# Patient Record
Sex: Female | Born: 1935 | Race: White | Hispanic: No | Marital: Married | Smoking: Former smoker
Health system: Southern US, Community
[De-identification: ages and names within clinical notes are randomized; demographics above are authoritative.]

## PROBLEM LIST (undated history)

## (undated) DIAGNOSIS — F419 Anxiety disorder, unspecified: Secondary | ICD-10-CM

## (undated) DIAGNOSIS — G309 Alzheimer's disease, unspecified: Secondary | ICD-10-CM

## (undated) DIAGNOSIS — G4730 Sleep apnea, unspecified: Secondary | ICD-10-CM

## (undated) DIAGNOSIS — F0280 Dementia in other diseases classified elsewhere without behavioral disturbance: Secondary | ICD-10-CM

## (undated) DIAGNOSIS — R55 Syncope and collapse: Secondary | ICD-10-CM

## (undated) DIAGNOSIS — E860 Dehydration: Secondary | ICD-10-CM

## (undated) DIAGNOSIS — R001 Bradycardia, unspecified: Secondary | ICD-10-CM

## (undated) DIAGNOSIS — I10 Essential (primary) hypertension: Secondary | ICD-10-CM

## (undated) DIAGNOSIS — C50919 Malignant neoplasm of unspecified site of unspecified female breast: Secondary | ICD-10-CM

## (undated) DIAGNOSIS — F329 Major depressive disorder, single episode, unspecified: Secondary | ICD-10-CM

## (undated) DIAGNOSIS — F32A Depression, unspecified: Secondary | ICD-10-CM

## (undated) DIAGNOSIS — E039 Hypothyroidism, unspecified: Secondary | ICD-10-CM

## (undated) DIAGNOSIS — N390 Urinary tract infection, site not specified: Secondary | ICD-10-CM

## (undated) HISTORY — PX: CHOLECYSTECTOMY: SHX55

## (undated) HISTORY — DX: Anxiety disorder, unspecified: F41.9

## (undated) HISTORY — PX: BREAST SURGERY: SHX581

## (undated) HISTORY — DX: Hypothyroidism, unspecified: E03.9

## (undated) HISTORY — DX: Dementia in other diseases classified elsewhere, unspecified severity, without behavioral disturbance, psychotic disturbance, mood disturbance, and anxiety: F02.80

## (undated) HISTORY — DX: Essential (primary) hypertension: I10

## (undated) HISTORY — DX: Sleep apnea, unspecified: G47.30

## (undated) HISTORY — DX: Dehydration: E86.0

## (undated) HISTORY — DX: Bradycardia, unspecified: R00.1

## (undated) HISTORY — DX: Syncope and collapse: R55

## (undated) HISTORY — DX: Urinary tract infection, site not specified: N39.0

## (undated) HISTORY — DX: Malignant neoplasm of unspecified site of unspecified female breast: C50.919

## (undated) HISTORY — DX: Depression, unspecified: F32.A

## (undated) HISTORY — DX: Major depressive disorder, single episode, unspecified: F32.9

## (undated) HISTORY — DX: Alzheimer's disease, unspecified: G30.9

---

## 1998-01-20 ENCOUNTER — Other Ambulatory Visit: Admission: RE | Admit: 1998-01-20 | Discharge: 1998-01-20 | Payer: Self-pay | Admitting: Family Medicine

## 1998-04-08 ENCOUNTER — Ambulatory Visit (HOSPITAL_COMMUNITY): Admission: RE | Admit: 1998-04-08 | Discharge: 1998-04-08 | Payer: Self-pay | Admitting: Gastroenterology

## 1998-04-25 ENCOUNTER — Encounter: Admission: RE | Admit: 1998-04-25 | Discharge: 1998-07-24 | Payer: Self-pay | Admitting: Gastroenterology

## 1998-05-10 ENCOUNTER — Emergency Department (HOSPITAL_COMMUNITY): Admission: EM | Admit: 1998-05-10 | Discharge: 1998-05-10 | Payer: Self-pay | Admitting: Family Medicine

## 1998-05-10 ENCOUNTER — Encounter: Payer: Self-pay | Admitting: Family Medicine

## 1999-05-26 ENCOUNTER — Emergency Department (HOSPITAL_COMMUNITY): Admission: EM | Admit: 1999-05-26 | Discharge: 1999-05-26 | Payer: Self-pay | Admitting: Internal Medicine

## 1999-05-26 ENCOUNTER — Encounter: Payer: Self-pay | Admitting: Internal Medicine

## 1999-12-01 ENCOUNTER — Encounter (INDEPENDENT_AMBULATORY_CARE_PROVIDER_SITE_OTHER): Payer: Self-pay | Admitting: Specialist

## 1999-12-01 ENCOUNTER — Ambulatory Visit (HOSPITAL_COMMUNITY): Admission: RE | Admit: 1999-12-01 | Discharge: 1999-12-01 | Payer: Self-pay | Admitting: Orthopedic Surgery

## 1999-12-29 ENCOUNTER — Encounter: Payer: Self-pay | Admitting: Emergency Medicine

## 1999-12-29 ENCOUNTER — Inpatient Hospital Stay (HOSPITAL_COMMUNITY): Admission: EM | Admit: 1999-12-29 | Discharge: 2000-01-03 | Payer: Self-pay | Admitting: Emergency Medicine

## 1999-12-31 ENCOUNTER — Encounter: Payer: Self-pay | Admitting: Internal Medicine

## 2000-02-27 ENCOUNTER — Ambulatory Visit (HOSPITAL_COMMUNITY): Admission: RE | Admit: 2000-02-27 | Discharge: 2000-02-27 | Payer: Self-pay | Admitting: Gastroenterology

## 2000-02-27 ENCOUNTER — Encounter (INDEPENDENT_AMBULATORY_CARE_PROVIDER_SITE_OTHER): Payer: Self-pay

## 2000-04-23 ENCOUNTER — Other Ambulatory Visit: Admission: RE | Admit: 2000-04-23 | Discharge: 2000-04-23 | Payer: Self-pay | Admitting: Family Medicine

## 2001-05-12 ENCOUNTER — Encounter: Admission: RE | Admit: 2001-05-12 | Discharge: 2001-05-12 | Payer: Self-pay | Admitting: Family Medicine

## 2001-05-12 ENCOUNTER — Encounter: Payer: Self-pay | Admitting: Family Medicine

## 2001-05-14 ENCOUNTER — Encounter: Payer: Self-pay | Admitting: Gastroenterology

## 2001-05-14 ENCOUNTER — Encounter: Admission: RE | Admit: 2001-05-14 | Discharge: 2001-05-14 | Payer: Self-pay | Admitting: Gastroenterology

## 2002-10-09 ENCOUNTER — Encounter (INDEPENDENT_AMBULATORY_CARE_PROVIDER_SITE_OTHER): Payer: Self-pay | Admitting: Specialist

## 2002-10-09 ENCOUNTER — Other Ambulatory Visit: Admission: RE | Admit: 2002-10-09 | Discharge: 2002-10-09 | Payer: Self-pay | Admitting: Radiology

## 2002-10-09 ENCOUNTER — Encounter: Payer: Self-pay | Admitting: Surgery

## 2002-10-09 ENCOUNTER — Encounter: Admission: RE | Admit: 2002-10-09 | Discharge: 2002-10-09 | Payer: Self-pay | Admitting: Surgery

## 2002-10-19 ENCOUNTER — Ambulatory Visit (HOSPITAL_COMMUNITY): Admission: RE | Admit: 2002-10-19 | Discharge: 2002-10-19 | Payer: Self-pay | Admitting: Surgery

## 2002-10-19 ENCOUNTER — Encounter: Payer: Self-pay | Admitting: Surgery

## 2002-10-20 ENCOUNTER — Encounter: Payer: Self-pay | Admitting: Surgery

## 2002-10-23 ENCOUNTER — Encounter: Payer: Self-pay | Admitting: Surgery

## 2002-10-23 ENCOUNTER — Encounter: Admission: RE | Admit: 2002-10-23 | Discharge: 2002-10-23 | Payer: Self-pay | Admitting: Surgery

## 2002-10-26 ENCOUNTER — Encounter: Payer: Self-pay | Admitting: Surgery

## 2002-10-26 ENCOUNTER — Ambulatory Visit (HOSPITAL_BASED_OUTPATIENT_CLINIC_OR_DEPARTMENT_OTHER): Admission: RE | Admit: 2002-10-26 | Discharge: 2002-10-26 | Payer: Self-pay | Admitting: General Surgery

## 2002-10-26 ENCOUNTER — Encounter (INDEPENDENT_AMBULATORY_CARE_PROVIDER_SITE_OTHER): Payer: Self-pay | Admitting: *Deleted

## 2002-10-26 ENCOUNTER — Encounter: Admission: RE | Admit: 2002-10-26 | Discharge: 2002-10-26 | Payer: Self-pay | Admitting: Surgery

## 2002-11-03 ENCOUNTER — Ambulatory Visit: Admission: RE | Admit: 2002-11-03 | Discharge: 2003-01-27 | Payer: Self-pay | Admitting: Radiation Oncology

## 2003-07-22 ENCOUNTER — Ambulatory Visit (HOSPITAL_COMMUNITY): Admission: RE | Admit: 2003-07-22 | Discharge: 2003-07-22 | Payer: Self-pay | Admitting: Oncology

## 2003-07-22 ENCOUNTER — Encounter: Admission: RE | Admit: 2003-07-22 | Discharge: 2003-07-22 | Payer: Self-pay | Admitting: Oncology

## 2003-11-02 ENCOUNTER — Encounter: Admission: RE | Admit: 2003-11-02 | Discharge: 2003-11-02 | Payer: Self-pay | Admitting: Family Medicine

## 2003-12-10 ENCOUNTER — Encounter: Admission: RE | Admit: 2003-12-10 | Discharge: 2003-12-10 | Payer: Self-pay | Admitting: Family Medicine

## 2004-04-24 ENCOUNTER — Ambulatory Visit (HOSPITAL_COMMUNITY): Admission: RE | Admit: 2004-04-24 | Discharge: 2004-04-24 | Payer: Self-pay | Admitting: Oncology

## 2004-05-22 ENCOUNTER — Other Ambulatory Visit: Admission: RE | Admit: 2004-05-22 | Discharge: 2004-05-22 | Payer: Self-pay | Admitting: Family Medicine

## 2004-07-24 ENCOUNTER — Encounter: Admission: RE | Admit: 2004-07-24 | Discharge: 2004-07-24 | Payer: Self-pay | Admitting: Oncology

## 2004-10-23 ENCOUNTER — Ambulatory Visit: Payer: Self-pay | Admitting: Oncology

## 2004-10-25 ENCOUNTER — Ambulatory Visit (HOSPITAL_COMMUNITY): Admission: RE | Admit: 2004-10-25 | Discharge: 2004-10-25 | Payer: Self-pay | Admitting: Oncology

## 2005-01-13 ENCOUNTER — Emergency Department (HOSPITAL_COMMUNITY): Admission: EM | Admit: 2005-01-13 | Discharge: 2005-01-13 | Payer: Self-pay | Admitting: Emergency Medicine

## 2005-03-01 ENCOUNTER — Ambulatory Visit (HOSPITAL_COMMUNITY): Admission: RE | Admit: 2005-03-01 | Discharge: 2005-03-01 | Payer: Self-pay | Admitting: Gastroenterology

## 2005-03-01 ENCOUNTER — Encounter (INDEPENDENT_AMBULATORY_CARE_PROVIDER_SITE_OTHER): Payer: Self-pay | Admitting: *Deleted

## 2005-04-24 ENCOUNTER — Ambulatory Visit: Payer: Self-pay | Admitting: Oncology

## 2005-07-26 ENCOUNTER — Encounter: Admission: RE | Admit: 2005-07-26 | Discharge: 2005-07-26 | Payer: Self-pay | Admitting: Oncology

## 2005-10-03 ENCOUNTER — Encounter: Admission: RE | Admit: 2005-10-03 | Discharge: 2005-10-03 | Payer: Self-pay | Admitting: Surgery

## 2005-11-26 ENCOUNTER — Encounter: Payer: Self-pay | Admitting: Pulmonary Disease

## 2006-04-24 ENCOUNTER — Ambulatory Visit: Payer: Self-pay | Admitting: Oncology

## 2006-04-26 LAB — COMPREHENSIVE METABOLIC PANEL
AST: 35 U/L (ref 0–37)
Alkaline Phosphatase: 59 U/L (ref 39–117)
BUN: 20 mg/dL (ref 6–23)
Glucose, Bld: 114 mg/dL — ABNORMAL HIGH (ref 70–99)
Sodium: 142 mEq/L (ref 135–145)
Total Bilirubin: 0.6 mg/dL (ref 0.3–1.2)

## 2006-04-26 LAB — CBC WITH DIFFERENTIAL/PLATELET
BASO%: 0.6 % (ref 0.0–2.0)
Basophils Absolute: 0 10*3/uL (ref 0.0–0.1)
EOS%: 4.1 % (ref 0.0–7.0)
HGB: 13.6 g/dL (ref 11.6–15.9)
MCH: 31.1 pg (ref 26.0–34.0)
MCHC: 34.4 g/dL (ref 32.0–36.0)
MCV: 90.3 fL (ref 81.0–101.0)
MONO%: 8.3 % (ref 0.0–13.0)
RDW: 13.7 % (ref 11.3–14.5)

## 2006-05-06 ENCOUNTER — Encounter: Admission: RE | Admit: 2006-05-06 | Discharge: 2006-05-06 | Payer: Self-pay | Admitting: Oncology

## 2006-06-20 ENCOUNTER — Encounter: Admission: RE | Admit: 2006-06-20 | Discharge: 2006-06-20 | Payer: Self-pay | Admitting: Gastroenterology

## 2006-07-29 ENCOUNTER — Encounter: Admission: RE | Admit: 2006-07-29 | Discharge: 2006-07-29 | Payer: Self-pay | Admitting: Oncology

## 2006-12-04 ENCOUNTER — Other Ambulatory Visit: Admission: RE | Admit: 2006-12-04 | Discharge: 2006-12-04 | Payer: Self-pay | Admitting: Obstetrics and Gynecology

## 2006-12-05 ENCOUNTER — Ambulatory Visit (HOSPITAL_COMMUNITY): Admission: RE | Admit: 2006-12-05 | Discharge: 2006-12-05 | Payer: Self-pay | Admitting: Obstetrics and Gynecology

## 2007-04-23 ENCOUNTER — Ambulatory Visit: Payer: Self-pay | Admitting: Oncology

## 2007-04-25 LAB — CBC WITH DIFFERENTIAL/PLATELET
Basophils Absolute: 0 10*3/uL (ref 0.0–0.1)
Eosinophils Absolute: 0.4 10*3/uL (ref 0.0–0.5)
HGB: 13.6 g/dL (ref 11.6–15.9)
LYMPH%: 28.8 % (ref 14.0–48.0)
MCV: 88.2 fL (ref 81.0–101.0)
MONO%: 8.1 % (ref 0.0–13.0)
NEUT#: 3.4 10*3/uL (ref 1.5–6.5)
NEUT%: 56.1 % (ref 39.6–76.8)
Platelets: 153 10*3/uL (ref 145–400)

## 2007-04-25 LAB — COMPREHENSIVE METABOLIC PANEL
Albumin: 4.2 g/dL (ref 3.5–5.2)
Alkaline Phosphatase: 61 U/L (ref 39–117)
BUN: 13 mg/dL (ref 6–23)
Creatinine, Ser: 0.83 mg/dL (ref 0.40–1.20)
Glucose, Bld: 94 mg/dL (ref 70–99)
Total Bilirubin: 0.9 mg/dL (ref 0.3–1.2)

## 2007-06-21 ENCOUNTER — Encounter: Admission: RE | Admit: 2007-06-21 | Discharge: 2007-06-21 | Payer: Self-pay | Admitting: Family Medicine

## 2007-08-18 ENCOUNTER — Encounter: Admission: RE | Admit: 2007-08-18 | Discharge: 2007-08-18 | Payer: Self-pay | Admitting: Oncology

## 2007-08-28 ENCOUNTER — Encounter: Admission: RE | Admit: 2007-08-28 | Discharge: 2007-08-28 | Payer: Self-pay | Admitting: Family Medicine

## 2007-09-11 ENCOUNTER — Ambulatory Visit: Payer: Self-pay | Admitting: Oncology

## 2007-09-15 LAB — CBC WITH DIFFERENTIAL/PLATELET
BASO%: 0.6 % (ref 0.0–2.0)
EOS%: 5.8 % (ref 0.0–7.0)
HCT: 39.2 % (ref 34.8–46.6)
MCH: 31.1 pg (ref 26.0–34.0)
MCHC: 35.2 g/dL (ref 32.0–36.0)
MONO#: 0.4 10*3/uL (ref 0.1–0.9)
RBC: 4.44 10*6/uL (ref 3.70–5.32)
RDW: 13.3 % (ref 11.3–14.5)
WBC: 4.7 10*3/uL (ref 3.9–10.0)
lymph#: 1.8 10*3/uL (ref 0.9–3.3)

## 2007-09-15 LAB — COMPREHENSIVE METABOLIC PANEL
ALT: 45 U/L — ABNORMAL HIGH (ref 0–35)
AST: 35 U/L (ref 0–37)
Albumin: 4.2 g/dL (ref 3.5–5.2)
CO2: 26 mEq/L (ref 19–32)
Calcium: 9.7 mg/dL (ref 8.4–10.5)
Chloride: 106 mEq/L (ref 96–112)
Creatinine, Ser: 0.88 mg/dL (ref 0.40–1.20)
Potassium: 3.9 mEq/L (ref 3.5–5.3)
Sodium: 144 mEq/L (ref 135–145)
Total Protein: 7.4 g/dL (ref 6.0–8.3)

## 2007-09-15 LAB — CANCER ANTIGEN 27.29: CA 27.29: 24 U/mL (ref 0–39)

## 2007-10-09 ENCOUNTER — Emergency Department (HOSPITAL_COMMUNITY): Admission: EM | Admit: 2007-10-09 | Discharge: 2007-10-09 | Payer: Self-pay | Admitting: Emergency Medicine

## 2007-11-07 ENCOUNTER — Ambulatory Visit: Payer: Self-pay | Admitting: Oncology

## 2008-04-14 ENCOUNTER — Ambulatory Visit: Payer: Self-pay | Admitting: Pulmonary Disease

## 2008-04-14 DIAGNOSIS — C50919 Malignant neoplasm of unspecified site of unspecified female breast: Secondary | ICD-10-CM | POA: Insufficient documentation

## 2008-04-14 DIAGNOSIS — G4733 Obstructive sleep apnea (adult) (pediatric): Secondary | ICD-10-CM | POA: Insufficient documentation

## 2008-04-14 DIAGNOSIS — E039 Hypothyroidism, unspecified: Secondary | ICD-10-CM | POA: Insufficient documentation

## 2008-04-14 DIAGNOSIS — I10 Essential (primary) hypertension: Secondary | ICD-10-CM | POA: Insufficient documentation

## 2008-04-29 ENCOUNTER — Telehealth: Payer: Self-pay | Admitting: Pulmonary Disease

## 2008-05-10 ENCOUNTER — Encounter: Payer: Self-pay | Admitting: Pulmonary Disease

## 2008-05-10 ENCOUNTER — Ambulatory Visit (HOSPITAL_BASED_OUTPATIENT_CLINIC_OR_DEPARTMENT_OTHER): Admission: RE | Admit: 2008-05-10 | Discharge: 2008-05-10 | Payer: Self-pay | Admitting: Pulmonary Disease

## 2008-05-18 ENCOUNTER — Ambulatory Visit: Payer: Self-pay | Admitting: Pulmonary Disease

## 2008-05-20 ENCOUNTER — Telehealth (INDEPENDENT_AMBULATORY_CARE_PROVIDER_SITE_OTHER): Payer: Self-pay | Admitting: *Deleted

## 2008-05-21 ENCOUNTER — Ambulatory Visit: Payer: Self-pay | Admitting: Pulmonary Disease

## 2008-06-10 ENCOUNTER — Ambulatory Visit: Payer: Self-pay | Admitting: Pulmonary Disease

## 2008-06-10 ENCOUNTER — Telehealth (INDEPENDENT_AMBULATORY_CARE_PROVIDER_SITE_OTHER): Payer: Self-pay | Admitting: *Deleted

## 2008-06-16 ENCOUNTER — Telehealth (INDEPENDENT_AMBULATORY_CARE_PROVIDER_SITE_OTHER): Payer: Self-pay | Admitting: *Deleted

## 2008-06-22 ENCOUNTER — Telehealth (INDEPENDENT_AMBULATORY_CARE_PROVIDER_SITE_OTHER): Payer: Self-pay | Admitting: *Deleted

## 2008-07-26 ENCOUNTER — Telehealth: Payer: Self-pay | Admitting: Pulmonary Disease

## 2008-08-18 ENCOUNTER — Encounter: Admission: RE | Admit: 2008-08-18 | Discharge: 2008-08-18 | Payer: Self-pay | Admitting: Oncology

## 2008-11-01 ENCOUNTER — Ambulatory Visit: Payer: Self-pay | Admitting: Oncology

## 2008-11-03 LAB — CBC WITH DIFFERENTIAL/PLATELET
Basophils Absolute: 0 10*3/uL (ref 0.0–0.1)
Eosinophils Absolute: 0.2 10*3/uL (ref 0.0–0.5)
HGB: 13.3 g/dL (ref 11.6–15.9)
LYMPH%: 41.8 % (ref 14.0–49.7)
MCV: 90.6 fL (ref 79.5–101.0)
MONO#: 0.4 10*3/uL (ref 0.1–0.9)
MONO%: 9.4 % (ref 0.0–14.0)
NEUT#: 1.9 10*3/uL (ref 1.5–6.5)
Platelets: 129 10*3/uL — ABNORMAL LOW (ref 145–400)
WBC: 4.4 10*3/uL (ref 3.9–10.3)

## 2008-11-04 LAB — COMPREHENSIVE METABOLIC PANEL
Albumin: 4.3 g/dL (ref 3.5–5.2)
Alkaline Phosphatase: 58 U/L (ref 39–117)
BUN: 18 mg/dL (ref 6–23)
CO2: 28 mEq/L (ref 19–32)
Glucose, Bld: 92 mg/dL (ref 70–99)
Potassium: 4.1 mEq/L (ref 3.5–5.3)
Sodium: 140 mEq/L (ref 135–145)
Total Protein: 7.2 g/dL (ref 6.0–8.3)

## 2008-11-04 LAB — CANCER ANTIGEN 27.29: CA 27.29: 30 U/mL (ref 0–39)

## 2008-11-04 LAB — VITAMIN D 25 HYDROXY (VIT D DEFICIENCY, FRACTURES): Vit D, 25-Hydroxy: 36 ng/mL (ref 30–89)

## 2009-05-02 ENCOUNTER — Encounter: Admission: RE | Admit: 2009-05-02 | Discharge: 2009-05-02 | Payer: Self-pay | Admitting: Family Medicine

## 2009-08-29 ENCOUNTER — Encounter: Admission: RE | Admit: 2009-08-29 | Discharge: 2009-08-29 | Payer: Self-pay | Admitting: Surgery

## 2009-11-08 ENCOUNTER — Ambulatory Visit: Payer: Self-pay | Admitting: Oncology

## 2009-11-14 ENCOUNTER — Encounter: Admission: RE | Admit: 2009-11-14 | Discharge: 2009-11-14 | Payer: Self-pay | Admitting: Family Medicine

## 2010-01-19 ENCOUNTER — Emergency Department (HOSPITAL_COMMUNITY): Admission: EM | Admit: 2010-01-19 | Discharge: 2010-01-19 | Payer: Self-pay | Admitting: Emergency Medicine

## 2010-03-03 ENCOUNTER — Encounter: Admission: RE | Admit: 2010-03-03 | Discharge: 2010-03-03 | Payer: Self-pay | Admitting: Neurology

## 2010-10-17 ENCOUNTER — Other Ambulatory Visit: Payer: Self-pay | Admitting: Internal Medicine

## 2010-10-17 ENCOUNTER — Ambulatory Visit
Admission: RE | Admit: 2010-10-17 | Discharge: 2010-10-17 | Disposition: A | Discharge status: Home / Self Care | Payer: Medicare Other | Source: Ambulatory Visit | Attending: *Deleted | Admitting: *Deleted

## 2010-10-17 DIAGNOSIS — R1032 Left lower quadrant pain: Secondary | ICD-10-CM

## 2010-10-30 LAB — DIFFERENTIAL
Basophils Relative: 1 % (ref 0–1)
Eosinophils Absolute: 0.1 10*3/uL (ref 0.0–0.7)
Lymphs Abs: 1.2 10*3/uL (ref 0.7–4.0)
Monocytes Relative: 12 % (ref 3–12)
Neutrophils Relative %: 48 % (ref 43–77)

## 2010-10-30 LAB — URINALYSIS, ROUTINE W REFLEX MICROSCOPIC
Bilirubin Urine: NEGATIVE
Glucose, UA: NEGATIVE mg/dL
Specific Gravity, Urine: 1.007 (ref 1.005–1.030)
Urobilinogen, UA: 0.2 mg/dL (ref 0.0–1.0)
pH: 7 (ref 5.0–8.0)

## 2010-10-30 LAB — CBC
HCT: 36 % (ref 36.0–46.0)
MCHC: 34.2 g/dL (ref 30.0–36.0)
MCV: 91.5 fL (ref 78.0–100.0)

## 2010-10-30 LAB — BASIC METABOLIC PANEL
Calcium: 9.1 mg/dL (ref 8.4–10.5)
GFR calc Af Amer: >60 mL/min (ref >60)
Sodium: 142 mEq/L (ref 135–145)

## 2010-10-30 LAB — POCT CARDIAC MARKERS

## 2010-11-29 ENCOUNTER — Emergency Department (HOSPITAL_COMMUNITY): Payer: Medicare Other

## 2010-11-29 ENCOUNTER — Inpatient Hospital Stay (HOSPITAL_COMMUNITY)
Admission: EM | Admit: 2010-11-29 | Discharge: 2010-12-04 | DRG: 312 | Disposition: A | Discharge status: Nursing Home (Includes ICF) | Payer: Medicare Other | Attending: Cardiology | Admitting: Cardiology

## 2010-11-29 DIAGNOSIS — Z853 Personal history of malignant neoplasm of breast: Secondary | ICD-10-CM

## 2010-11-29 DIAGNOSIS — E039 Hypothyroidism, unspecified: Secondary | ICD-10-CM | POA: Diagnosis present

## 2010-11-29 DIAGNOSIS — G309 Alzheimer's disease, unspecified: Secondary | ICD-10-CM | POA: Diagnosis present

## 2010-11-29 DIAGNOSIS — F341 Dysthymic disorder: Secondary | ICD-10-CM | POA: Diagnosis present

## 2010-11-29 DIAGNOSIS — I10 Essential (primary) hypertension: Secondary | ICD-10-CM | POA: Diagnosis present

## 2010-11-29 DIAGNOSIS — W07XXXA Fall from chair, initial encounter: Secondary | ICD-10-CM | POA: Diagnosis present

## 2010-11-29 DIAGNOSIS — R55 Syncope and collapse: Principal | ICD-10-CM | POA: Diagnosis present

## 2010-11-29 DIAGNOSIS — I498 Other specified cardiac arrhythmias: Secondary | ICD-10-CM | POA: Diagnosis present

## 2010-11-29 DIAGNOSIS — T448X5A Adverse effect of centrally-acting and adrenergic-neuron-blocking agents, initial encounter: Secondary | ICD-10-CM | POA: Diagnosis present

## 2010-11-29 DIAGNOSIS — T441X5A Adverse effect of other parasympathomimetics [cholinergics], initial encounter: Secondary | ICD-10-CM | POA: Diagnosis present

## 2010-11-29 DIAGNOSIS — F0280 Dementia in other diseases classified elsewhere without behavioral disturbance: Secondary | ICD-10-CM | POA: Diagnosis present

## 2010-11-29 DIAGNOSIS — Y9210 Unspecified residential institution as the place of occurrence of the external cause: Secondary | ICD-10-CM | POA: Diagnosis present

## 2010-11-29 LAB — CBC
HCT: 38.5 % (ref 36.0–46.0)
Hemoglobin: 12.3 g/dL (ref 12.0–15.0)
MCH: 29.1 pg (ref 26.0–34.0)
MCHC: 31.9 g/dL (ref 30.0–36.0)
MCV: 91 fL (ref 78.0–100.0)

## 2010-11-29 LAB — URINE MICROSCOPIC-ADD ON

## 2010-11-29 LAB — POCT CARDIAC MARKERS
CKMB, poc: 1.1 ng/mL (ref 1.0–8.0)
Myoglobin, poc: 53.5 ng/mL (ref 12–200)

## 2010-11-29 LAB — URINALYSIS, ROUTINE W REFLEX MICROSCOPIC
Hgb urine dipstick: NEGATIVE
Ketones, ur: NEGATIVE mg/dL
Nitrite: NEGATIVE
Protein, ur: NEGATIVE mg/dL
Specific Gravity, Urine: 1.007 (ref 1.005–1.030)
pH: 7 (ref 5.0–8.0)

## 2010-11-29 LAB — COMPREHENSIVE METABOLIC PANEL
ALT: 18 U/L (ref 0–35)
AST: 22 U/L (ref 0–37)
Alkaline Phosphatase: 67 U/L (ref 39–117)
Chloride: 104 mEq/L (ref 96–112)
Creatinine, Ser: 0.82 mg/dL (ref 0.4–1.2)
GFR calc Af Amer: >60 mL/min (ref >60)
Glucose, Bld: 85 mg/dL (ref 70–99)
Potassium: 4.1 mEq/L (ref 3.5–5.1)
Total Protein: 7 g/dL (ref 6.0–8.3)

## 2010-11-29 LAB — DIFFERENTIAL
Basophils Absolute: 0 10*3/uL (ref 0.0–0.1)
Eosinophils Relative: 5 % (ref 0–5)
Monocytes Absolute: 0.5 10*3/uL (ref 0.1–1.0)
Neutro Abs: 1.9 10*3/uL (ref 1.7–7.7)

## 2010-11-30 LAB — BASIC METABOLIC PANEL
GFR calc non Af Amer: >60 mL/min (ref >60)
Glucose, Bld: 94 mg/dL (ref 70–99)
Potassium: 3.7 mEq/L (ref 3.5–5.1)
Sodium: 141 mEq/L (ref 135–145)

## 2010-11-30 LAB — CBC
HCT: 35.4 % — ABNORMAL LOW (ref 36.0–46.0)
RDW: 13.4 % (ref 11.5–15.5)
WBC: 3.6 10*3/uL — ABNORMAL LOW (ref 4.0–10.5)

## 2010-11-30 LAB — MRSA PCR SCREENING: MRSA by PCR: POSITIVE — AB

## 2010-11-30 LAB — TSH: TSH: 4.288 u[IU]/mL (ref 0.350–4.500)

## 2010-12-06 ENCOUNTER — Inpatient Hospital Stay (HOSPITAL_COMMUNITY)
Admission: EM | Admit: 2010-12-06 | Discharge: 2010-12-08 | DRG: 690 | Disposition: A | Discharge status: Nursing Home (Includes ICF) | Payer: Medicare Other | Attending: Internal Medicine | Admitting: Internal Medicine

## 2010-12-06 DIAGNOSIS — F19951 Other psychoactive substance use, unspecified with psychoactive substance-induced psychotic disorder with hallucinations: Secondary | ICD-10-CM | POA: Diagnosis present

## 2010-12-06 DIAGNOSIS — F0280 Dementia in other diseases classified elsewhere without behavioral disturbance: Secondary | ICD-10-CM | POA: Diagnosis present

## 2010-12-06 DIAGNOSIS — I10 Essential (primary) hypertension: Secondary | ICD-10-CM | POA: Diagnosis present

## 2010-12-06 DIAGNOSIS — F19921 Other psychoactive substance use, unspecified with intoxication with delirium: Secondary | ICD-10-CM | POA: Diagnosis present

## 2010-12-06 DIAGNOSIS — G4730 Sleep apnea, unspecified: Secondary | ICD-10-CM | POA: Diagnosis present

## 2010-12-06 DIAGNOSIS — T50995A Adverse effect of other drugs, medicaments and biological substances, initial encounter: Secondary | ICD-10-CM | POA: Diagnosis present

## 2010-12-06 DIAGNOSIS — F341 Dysthymic disorder: Secondary | ICD-10-CM | POA: Diagnosis present

## 2010-12-06 DIAGNOSIS — N390 Urinary tract infection, site not specified: Principal | ICD-10-CM | POA: Diagnosis present

## 2010-12-06 DIAGNOSIS — E860 Dehydration: Secondary | ICD-10-CM | POA: Diagnosis present

## 2010-12-06 DIAGNOSIS — G309 Alzheimer's disease, unspecified: Secondary | ICD-10-CM | POA: Diagnosis present

## 2010-12-06 DIAGNOSIS — IMO0002 Reserved for concepts with insufficient information to code with codable children: Secondary | ICD-10-CM | POA: Diagnosis present

## 2010-12-06 DIAGNOSIS — E039 Hypothyroidism, unspecified: Secondary | ICD-10-CM | POA: Diagnosis present

## 2010-12-06 DIAGNOSIS — Z79899 Other long term (current) drug therapy: Secondary | ICD-10-CM

## 2010-12-06 DIAGNOSIS — Z853 Personal history of malignant neoplasm of breast: Secondary | ICD-10-CM

## 2010-12-06 LAB — CBC
Hemoglobin: 11.2 g/dL — ABNORMAL LOW (ref 12.0–15.0)
RBC: 3.77 MIL/uL — ABNORMAL LOW (ref 3.87–5.11)
WBC: 4.8 10*3/uL (ref 4.0–10.5)

## 2010-12-06 LAB — URINALYSIS, ROUTINE W REFLEX MICROSCOPIC
Glucose, UA: NEGATIVE mg/dL
Hgb urine dipstick: NEGATIVE
Specific Gravity, Urine: 1.015 (ref 1.005–1.030)
Urobilinogen, UA: 1 mg/dL (ref 0.0–1.0)

## 2010-12-06 LAB — BASIC METABOLIC PANEL
CO2: 26 mEq/L (ref 19–32)
Chloride: 101 mEq/L (ref 96–112)
GFR calc Af Amer: 44 mL/min — ABNORMAL LOW (ref >60)
Potassium: 4.1 mEq/L (ref 3.5–5.1)
Sodium: 135 mEq/L (ref 135–145)

## 2010-12-06 LAB — DIFFERENTIAL
Basophils Absolute: 0 10*3/uL (ref 0.0–0.1)
Basophils Relative: 1 % (ref 0–1)
Monocytes Relative: 10 % (ref 3–12)
Neutro Abs: 2.6 10*3/uL (ref 1.7–7.7)
Neutrophils Relative %: 53 % (ref 43–77)

## 2010-12-06 LAB — URINE MICROSCOPIC-ADD ON

## 2010-12-07 LAB — GLUCOSE, CAPILLARY: Glucose-Capillary: 87 mg/dL (ref 70–99)

## 2010-12-07 LAB — BASIC METABOLIC PANEL
BUN: 22 mg/dL (ref 6–23)
Chloride: 104 mEq/L (ref 96–112)
Creatinine, Ser: 1.15 mg/dL (ref 0.4–1.2)
Glucose, Bld: 85 mg/dL (ref 70–99)

## 2010-12-07 LAB — MRSA PCR SCREENING: MRSA by PCR: NEGATIVE

## 2010-12-08 LAB — CBC
MCH: 29.2 pg (ref 26.0–34.0)
MCHC: 32.5 g/dL (ref 30.0–36.0)
MCV: 89.6 fL (ref 78.0–100.0)
Platelets: 136 10*3/uL — ABNORMAL LOW (ref 150–400)
RBC: 4.15 MIL/uL (ref 3.87–5.11)
RDW: 13.4 % (ref 11.5–15.5)

## 2010-12-08 LAB — BASIC METABOLIC PANEL
BUN: 16 mg/dL (ref 6–23)
Calcium: 9.1 mg/dL (ref 8.4–10.5)
Chloride: 104 mEq/L (ref 96–112)
Creatinine, Ser: 0.92 mg/dL (ref 0.4–1.2)
GFR calc Af Amer: >60 mL/min (ref >60)
GFR calc non Af Amer: 59 mL/min — ABNORMAL LOW (ref >60)

## 2010-12-08 LAB — URINE CULTURE: Colony Count: 30000

## 2010-12-08 NOTE — Discharge Summary (Addendum)
[name], [name]                ACCOUNT NO.:  [id number]  MEDICAL RECORD NO.:  [id number]           PATIENT TYPE:  I  LOCATION:  1444                         FACILITY:  [facility]  PHYSICIAN:  Peter C. Eden Emms, MD, FACCDATE OF BIRTH:  30-Nov-1926  DATE OF ADMISSION:  11/29/2010 DATE OF DISCHARGE:  12/04/2010                              DISCHARGE SUMMARY   DISCHARGE DIAGNOSES: 1. Syncope. 2. Sinus bradycardia.     a.     Beta-blocker and Aricept discontinued with discharge heart      rate running 60s to 80s. 3. Hypertension. 4. Alzheimer's disease. 5. Depression. 6. Anxiety. 7. Breast cancer status post lumpectomy bilaterally as well as     radiation. 8. Sleep apnea. 9. Hypothyroidism with normal TSH and free T4 this admission.  HISTORY OF PRESENT ILLNESS:  [name] Briggs is an 77 year old female with a history of Alzheimer's disease, breast cancer, hypertension, and hypothyroidism who had approximately 2 to 3 episodes of loss ofconsciousness over the last week.  She is somewhat demented so her history was somewhat of questionable reliability.  On the day of admission, she apparently fell out of a chair with an episode of unresponsiveness and was transferred to [facility] ER.  Upon arrival, her heart rate was in the 30s and she received atropine 0.5 mg times one with responsive heart rates into the 50s.  Blood pressure was stable the entire time.  EKG showed right bundle-branch block with LVH and T-wave inversion in lead III.  There is some report that she may have had secondary heart block, although strips were not available from this. The patient remained in sinus bradycardia while being initially evaluated.  Her beta-blocker was stopped on admission, but she continued to have heart rates in the 40s.  Therefore, her Aricept was also discontinued and her heart rate responded with an increase into the 50s and 60s.  She was seen by Pharmacy who recommended dementia agent less likely  caused bradycardia and memantine was felt to be a good substitute, with recommendations to start 5 mg daily times 1 week and then 5 mg b.i.d. times 1 week, then 10 mg in the morning and 5 mg in the afternoon times 1 week, then 10 mg b.i.d. if she tolerates it.  This was initiated in the hospital.  She was seen by Case Management for discharge planning and was felt to be suitable to go back to a skilled nursing facility.  Dr. Eden Emms has seen and examined her today and feels she is stable for discharge.  DISCHARGE LABORATORY DATA:  None recently.  Her admission CBC showed WBCs 3.6, hemoglobin 11.4, hematocrit 35.4, platelet count 120.  Sodium 141, potassium 2.7, chloride 107, CO2 28, glucose 94, BUN 11, creatinine 0.73.  LFTs are within normal limits.  Cardiac enzymes negative x2.  TSH 4.288 with free T4 of 0.80.  UA showed trace leukocytes.  She was MRSA PCR positive.  STUDIES:  Chest x-ray November 29, 2010, showed mild cardiomegaly with interval increase in heart size since September 2010.  Scarring in the left upper lobe.  No acute cardiopulmonary process.  DISCHARGE MEDICATIONS: 1. Amlodipine 5 mg daily. 2. Memantine 5 mg one tablet daily through April 29.  On April 30,     change it to one tablet twice a day through May 6.  On May 7 change     it to two tablets in the morning and one tablet at night through     May 13, and on May 14 change it to two tablets twice a day.  Only     increase the medicine if tolerated.  Follow up with PCP for this     medicine. 3. Actigall 300 mg b.i.d. 4. Celexa 20 mg daily. 5. Cozaar 50 mg b.i.d. 6. Levothyroxine 100 mcg daily. 7. Os-Cal with vitamin D one tablet b.i.d.  DISPOSITION:  [name] Briggs will be discharged in stable condition to skilled nursing facility.  ACTIVITY:  She is instructed to increase activity slowly.  DIET:  Follow a low-sodium, heart-healthy diet.  FOLLOWUP:  She will follow up with Dr. Swaziland on Dec 21, 2010, at  1:30 p.m.  DURATION OF DISCHARGE ENCOUNTER:  Greater than 30 minutes including physician and PA time.     Lemuel Boodram, P.A.C.   ______________________________ [name] Briggs Eden Emms, MD, [facility]    DD/MEDQ  D:  12/04/2010  T:  12/04/2010  Job:  [id number]  cc:   Peter M. Swaziland, M.D. Fax: [phone]  Electronically Signed by Charlton Haws MD [facility] on 12/08/2010 11:30:06 AM Electronically Signed by Ronie Spies  on 12/12/2010 04:35:46 PM

## 2010-12-09 NOTE — H&P (Signed)
[name], [name] NO.:  [id number]  MEDICAL RECORD NO.:  [id number]           PATIENT TYPE:  E  LOCATION:  WLED                         FACILITY:  [facility]  PHYSICIAN:  Vania Rea, M.D. DATE OF BIRTH:  26-Apr-1927  DATE OF ADMISSION:  12/06/2010 DATE OF DISCHARGE:                             HISTORY & PHYSICAL   PRIMARY CARE PHYSICIAN:  Dr. Cam Hai and Dr. Bufford Spikes at the [facility], where the patient is a resident.  CC: Increased aggitation and homicidal verbage x 2 days.  HPI:  The patient was sent in to the [facility] Emergency Room by ambulance with a history of 2 days of threatening and disruptive behavior after having been started on Namenda .  The patient was evaluated in the emergency room and found to be demented, and  reasonable, but also found to have evidence of urinary tract infection and the hospitalist service called to assist with management.The patient denies any discomfort.  She denies frequency or dysuria. She denies fever or chills.  She denies any headache or neck stiffness. She does admit to having memory problems related to her diagnosis of "Alzheimer's."  She also admits that she sometimes gets very upset because people think she does not understand what they are doing because she has dementia.  She in a roundabout way admits to behaving threateningly at sometimes, but says she does not mean it and that sometimes her judgment is just very poor.  She has no comprehension of time or location, and she does not have a comprehensive idea of why she is at this facility, [place]  There are no other acute complaints.  PAST MEDICAL HISTORY: 1. Hypertension. 2. Alzheimer's dementia. 3. Depression. 4. Anxiety. 5. Hypothyroidism. 6. History of breast cancer, status post bilateral lumpectomy and     radiation. 7. History of sleep apnea. 8. Recent hospital admission  for syncope related to sinus bradycardia,     at which time her beta-blocker and Aricept were discontinued. 9. The patient was discharged 2 days ago.  MEDICATIONS: 1. Citalopram 20 mg daily. 2. Levothyroxine 100 mcg daily. 3. Losartan 50 mg twice daily. 4. Ursodiol 300 mg twice daily. 5. Calcium with vitamin D 600/200, 1 tablet twice daily. 6. Amlodipine 5 mg daily. 7. Namenda started at 5 mg daily 2 days ago.  No prior exposure.  ALLERGIES: 1. To ACE INHIBITORS, which cause urticaria and reported allergy also     to CALCIUM CHANNEL BLOCKER, although we note she is on AMLODIPINE. 2. Allergy listed to CIPRO.  SOCIAL HISTORY AND FAMILY HISTORY:  Reviewed from her previous admission and are unchanged.  REVIEW OF SYSTEMS:  Other than noted above, the patient denies all other problems.  Reports she likes to walk a lot, but has not been walking so much recently.  PHYSICAL EXAMINATION:  GENERAL:  Very pleasant, but quite demented elderly Caucasian lady lying comfortably in bed. VITAL SIGNS:  Temperature is 97.9, pulse 62, respiration 18, blood pressure 125/64, saturating at 98% on room air, respiratory rate is 18. HEENT:  Her pupils are round and equal.  Mucous membranes pink. Anicteric.  No cervical lymphadenopathy.  No thyromegaly NECK:  No carotid bruit. CHEST:  Clear to auscultation bilaterally. CARDIOVASCULAR:  Regular rhythm.  No murmur heard. ABDOMEN:  Scaphoid, soft, and nontender.  No masses. EXTREMITIES: Without edema.  Dorsalis pedis pulses are 2+ and bounding bilaterally.  She has some arthritic deformities of the hands. CENTRAL NERVOUS SYSTEM:  Cranial nerves II-XII are grossly intact.  She has no focal lateralizing signs.  LABORATORY DATA:  Her white count is 4.8, hemoglobin 11.2, platelets 138 with a normal differential.  Her sodium is 135, potassium 4.1, chloride 101, CO2 20, glucose 90, BUN 29, creatinine slightly elevated at 1.38. Her calcium is 9.3.  Urinalysis  shows cloudy urine, negative for nitrites, and a large amount of leukocyte, esterase.  Urine microscopy shows 11-20 white cells.  Liver functions were checked on her previous admission and was completely normal.  ASSESSMENT: 1. Urinary tract infection. 2. Dehydration as evidenced by elevated BUN and creatinine. 3. Dementia with episodic aggressive features. 4. Possible delirium associated with urinary tract infection or Namenda;     however, it is difficult to say without observing this patient over    a period of time.  PLAN: 1. We will admit this lady for hydration and treatment of her urinary     tract infection.  We will consult social worker to see if they can     persuade the nursing facility to work with this lady because she     does not appear to be     a threat, otherwise a new facility may have to be found. 2. It is possible that her acute delirium is related to the use of     Namenda, and this will be suspended at this time.  Other plans as     per orders.     Vania Rea, M.D.     LC/MEDQ  D:  12/06/2010  T:  12/06/2010  Job:  [id number]  cc:   Noralyn Pick. Eden Emms, MD, [facility] [address]  [address] [place] [address]  Bufford Spikes, DO  Jesse Sans. Wall, MD, FACC [address]  [address] [place] [address]  Lupita Raider, M.D. Fax: [phone]  Electronically Signed by Vania Rea M.D. on 12/09/2010 02:39:15 AM

## 2010-12-12 NOTE — H&P (Addendum)
[name] Briggs, [name] Briggs                ACCOUNT NO.:  [id number]  MEDICAL RECORD NO.:  [id number]           PATIENT TYPE:  I  LOCATION:  1444                         FACILITY:  [facility]  PHYSICIAN:  Ryleah Miramontes C. Sibyl Mikula, MD, FACCDATE OF BIRTH:  08-Sep-1926  DATE OF ADMISSION:  11/29/2010 DATE OF DISCHARGE:                             HISTORY & PHYSICAL   PRIMARY CARDIOLOGIST:  The patient saw Dr. Swaziland remotely.  CHIEF COMPLAINT:  Passed out.  HISTORY OF PRESENT ILLNESS:  [name] Briggs is an 77 year old female with history of Alzheimer disease, breast cancer, hypertension, and hypothyroidism who has had approximately 2-3 episodes of loss of consciousness over the last week.  She is somewhat demented, so her history is skewed and of questionable reliability.  Today apparently at her senior facility, she fell out of the chair with an episode of unresponsiveness.  She apparently did not have any precipitating symptoms including chest pain, shortness of breath, although it is unclear she would recall them.  She did not hit her head and has no other injuries.  She is currently not in pain.  Upon arrival to the ER, heart rate was in the 30s and she received atropine 0.5 mg x1 with response of the heart rate to the 50s.  EKG showed a right bundle-branch block with LVH and T-wave inversion in lead III.  There is some report that she may have had second-degree heart block, although the patient was moved from the regular ER to TCU and therefore strips are not available from this.  She is currently in sinus bradycardia.  PAST MEDICAL HISTORY: 1. Alzheimer disease. 2. Hypertension. 3. Depression/anxiety. 4. Breast cancer status post lumpectomy bilaterally as well as     radiation. 5. Sleep apnea. 6. Hypothyroidism.  MEDICATIONS: 1. Zebeta 5 mg daily. 2. Celexa 20 mg daily. 3. Levothyroxine 100 mcg daily. 4. Aricept 10 mg daily. 5. Calcium/vitamin D. 6. Losartan 50 mg b.i.d. 7. Ursodiol 300 mg  b.i.d.  ALLERGIES:  ACE INHIBITORS.  SOCIAL HISTORY:  The patient lives at [facility]. She quit smoking 20 years ago.  She denies any alcohol use.  FAMILY HISTORY:  Mother died of CHF in her 67s.  Her father had Parkinson disease.  REVIEW OF SYSTEMS:  See HPI for pertinent positives.  All other systems reviewed and otherwise negative.  LABORATORY DATA:  WBC 4.5, hemoglobin 12.3, hematocrit 38.5, and platelet count 137.  Sodium 141, potassium 4.1, chloride 104, CO2 of 29, glucose 85, BUN 15, and creatinine 0.82.  LFTs are normal.  Cardiac enzymes negative x2.  UA showed trace leukocytes, otherwise negative.  STUDIES:  Chest x-ray, November 29, 2010, showed mild cardiomegaly with interval increase in heart size since September 2010.  Scar in left upper lobe.  No acute cardiopulmonary process.  PHYSICAL EXAMINATION:  VITAL SIGNS:  Temperature 97.1, pulse currently 56, respirations 20, and blood pressure 151/72. GENERAL:  This is a pleasant elderly female in no acute distress. HEENT:  Normocephalic and atraumatic.  Extraocular movements intact. Clear sclerae.  Nares without discharge.  She has dentures. NECK:  Supple without carotid bruit. HEART:  Auscultation of the heart reveals slow rate without murmurs, rubs, or gallops, regular. LUNGS:  Auscultation of lungs reveals clear breath sounds without wheezes, rales, or rhonchi. ABDOMEN:  Soft, nontender, and nondistended.  Positive bowel sounds. EXTREMITIES:  Warm, dry with 1+ lower extremity edema bilaterally.  She has no evidence for DVT.  Pulses are brisk and she has TED hose present. NEUROLOGIC:  Grossly intact except for dementia.  She responds to most questions appropriately.  ASSESSMENT/PLAN:  The patient was seen and examined by Dr. Daleen Squibb and myself.  This is a very pleasant 77 year old female with history of Alzheimer disease, hypertension, and hypothyroidism with no real significant past cardiac history who  presents with episode of syncope in conjunction with severe bradycardia on a beta-blocker in the form of bisoprolol 5 mg p.o. daily at her living facility.  She received this this morning.  EKG shows right bundle-branch block with left axis deviation.  She is currently maintaining sinus rhythm in the 50s and responded well to atropine.  At this time, we will discontinue her bisoprolol completely and check TSH to make sure hypothyroidism is not contributing to the picture.  If her heart rate is greater than 60 tomorrow afternoon, she will likely be a candidate for discharge.  It may take 48 hours for the beta-blocker to leave her system, but she is currently stable and has no indication for urgent pacemaker at this time.  Blood pressure is also stable.  Her other home medicines will be continued.     Dayna Dunn, P.A.C.   ______________________________ Jesse Sans Daleen Squibb, MD, [facility]    DD/MEDQ  D:  11/29/2010  T:  11/30/2010  Job:  [id number]  cc:   Peter M. Swaziland, M.D.  Electronically Signed by Ronie Spies  on 12/12/2010 04:35:49 PM Electronically Signed by Valera Castle MD [facility] on 12/14/2010 08:19:08 AM

## 2010-12-15 ENCOUNTER — Encounter: Payer: Self-pay | Admitting: Cardiology

## 2010-12-15 DIAGNOSIS — F329 Major depressive disorder, single episode, unspecified: Secondary | ICD-10-CM | POA: Insufficient documentation

## 2010-12-15 DIAGNOSIS — C50919 Malignant neoplasm of unspecified site of unspecified female breast: Secondary | ICD-10-CM | POA: Insufficient documentation

## 2010-12-15 DIAGNOSIS — R55 Syncope and collapse: Secondary | ICD-10-CM | POA: Insufficient documentation

## 2010-12-15 DIAGNOSIS — N390 Urinary tract infection, site not specified: Secondary | ICD-10-CM | POA: Insufficient documentation

## 2010-12-15 DIAGNOSIS — E039 Hypothyroidism, unspecified: Secondary | ICD-10-CM | POA: Insufficient documentation

## 2010-12-15 DIAGNOSIS — F419 Anxiety disorder, unspecified: Secondary | ICD-10-CM | POA: Insufficient documentation

## 2010-12-15 DIAGNOSIS — I10 Essential (primary) hypertension: Secondary | ICD-10-CM | POA: Insufficient documentation

## 2010-12-15 DIAGNOSIS — R001 Bradycardia, unspecified: Secondary | ICD-10-CM | POA: Insufficient documentation

## 2010-12-15 DIAGNOSIS — E860 Dehydration: Secondary | ICD-10-CM | POA: Insufficient documentation

## 2010-12-15 DIAGNOSIS — F0280 Dementia in other diseases classified elsewhere without behavioral disturbance: Secondary | ICD-10-CM | POA: Insufficient documentation

## 2010-12-15 DIAGNOSIS — G4730 Sleep apnea, unspecified: Secondary | ICD-10-CM | POA: Insufficient documentation

## 2010-12-18 NOTE — Discharge Summary (Signed)
NAMEMONZERAT, HANDLER                ACCOUNT NO.:  [id number]  MEDICAL RECORD NO.:  [id number]           PATIENT TYPE:  I  LOCATION:  1336                         [facility]ACILITY:  [facility]  PHYSICIAN:  Rosanna Randy, MDDATE OF BIRTH:  Jan 03, 1927  DATE OF ADMISSION:  12/06/2010 DATE OF DISCHARGE:  12/08/2010                              DISCHARGE SUMMARY   DISCHARGING DIAGNOSES: 1. Delirium and abnormal behavior secondary to medication and urinary     tract infection. 2. Urinary tract infection. 3. Hypothyroidism. 4. Hypertension. 5. Depression. 6. Alzheimer's dementia. 7. History of breast cancer status post bilateral lumpectomy and     radiation. 8. Recent hospital admissions secondary to syncope related to sinus     bradycardia. 9. Anxiety. 10.History of cholecystectomy.  DISCHARGE MEDICATIONS: 1. Ursodiol 300 mg 1 capsule by mouth twice a day. 2. Amlodipine 5 mg 1 tablet by mouth daily. 3. Calcium carbonate with vitamin D 600/200 one tablet by mouth twice     a day with meals. 4. Celexa 20 mg 1 tablet by mouth daily. 5. Cozaar 50 mg 1 tablet by mouth twice a day. 6. Levothyroxine 100 mcg 1 tablet by mouth daily.  The patient has been instructed to stop taking Namenda due to side effects that most brought her into the hospital including specifically mild hallucinations, abnormal behavior, and confusion.  DISPOSITION AND FOLLOWUP:  The patient has been discharged in stable and improved condition with plan to send her back to [facility].  Over there, she is going to be followed by Dr. Lupita Raider and Dr. Bufford Spikes for further treatment and adjustment of her medications, specifically further treatment of her dementia.  The patient has remained home without having any straining or disruption behavior throughout this hospitalization.  She was completely treated with 3 days total of Rocephin IV antibiotics and at this moment is not having  any complaints.  The patient will be also followed by her Cardiology as previously instructed and will take the rest of her medications as directed.  No procedures were performed during this hospitalizations and no consultations were made.  BRIEF HISTORY OF PRESENT ILLNESS:  For full details, please refer to dictation done by Dr. Orvan Falconer on December 06, 2010, but briefly, the patient was sent to the [facility] Emergency Room by ambulance with history of 2 days of threatening and disruptive behavior after having had Namenda dose.  The patient was evaluated in the emergency room and found to be demented but reasonable but also found to have evidence of urinary tract infection.  Hospitalist service was called to assist with management and treatment.  PHYSICAL EXAMINATION:  GENERAL:  The patient was very pleasant, calm, alert, awake and oriented x2, intermittently oriented to time.  She was confused and was also referring mild hallucinations. VITAL SIGNS:  Temperature 97.9, heart rate 62, respiratory rate 18, blood pressure 125/64, oxygen saturation 98% on room air, respiratory rate was 18. HEENT:  PERRLA.  Extraocular muscles intact.  Anicteric.  No cervical lymphadenopathy.  No thyromegaly. NECK:  Without carotid bruits.  No JVD. CHEST:  Clear to  auscultation bilaterally. CARDIOVASCULAR:  Regular rhythm.  No murmurs, gallops or rubs. ABDOMEN:  Soft, nontender, nondistended.  Positive bowel sounds. EXTREMITIES:  Without edema.  Cranial nerves II through XII intact.  No focal deficits.  PERTINENT LABORATORY DATA:  White blood cells 4.8, hemoglobin 11.2, platelets 138 with normal differential.  Her sodium was 135, potassium 4.1, chloride 101, CO2 of 20, glucose 90, BUN 29, creatinine slightly elevated at 1.38, calcium was 9.3.  Over the last 2 days prior to admission, the patient reports not being eating and drinking as usual which will be the most likely cause for the patient to have  elevation of her creatinine.  Her urinalysis showed cloudy urine, negative for nitrites with large amount of leukocyte esterase, microscopy 11 to 20 white blood cells.  LFTs were normal.  The patient had urine cultures that demonstrated 30,000 colonies of gram-negative rods.  She had a negative MRSA PCR screening.  Free T4 0.8, TSH 4.288.  HOSPITAL COURSE BY PROBLEM: 1. Delirium, abnormal behavior.  Resolved.  Most likely secondary to     medication and also UTI in a patient with baseline dementia.  The     patient received full treatment of her urinary tract infection with     3 days of Rocephin for noncomplicated UTI and her Namenda was     discontinued. 2. UTI.  She received treatment with ceftriaxone for 3 days.  She is     completely asymptomatic.  No fever.  White blood cells normal. 3. Hypothyroidism, well controlled.  Plan is to continue Synthroid. 4. Hypertension, also well controlled.  We are going to continue her     Norvasc and Cozaar. 5. Depression/anxiety.  We are going to continue Celexa. 6. Alzheimer's dementia which is unchanged.  The patient is now back     to baseline.  At this point, we are going to discontinue the     Namenda due to side effects that the patient was experiencing.  The     patient is going to be reevaluated as an outpatient by primary care     physician and determine further treatment for her dementia. 7. Recent history of syncope secondary to bradycardia.  Heart rate     continued to be fluctuating between 50s and 60s, completely sinus     but the patient has not had any further episodes of syncope,     feeling dizzy or any other abnormality or complaints.  PLAN:  Plan is to continue home medications and she will follow with her cardiology as an outpatient.  The rest of her problems were completely stable throughout this hospitalization and no changes to her medications were needed.  PHYSICAL EXAMINATION:  VITAL SIGNS:  At discharge, the  patient's vital signs demonstrated a temperature of 98.3, heart rate 55, respiratory rate 20, blood pressure 133/71 with oxygen saturation of 98% on room air. GENERAL:  In general, she was in no acute distress, alert, awake, oriented x2, pleasant, calm, and able to interact properly  throughout conversation without any aggravated abnormal behavior. LUNGS: Clear to auscultation. HEART:  Mild bradycardia but no murmurs, gallops or rubs. EXTREMITIES:  No edema. ABDOMEN:  Soft, nontender, nondistended with positive bowel sounds. NEUROLOGIC EXAM:  Nonfocal.  LABORATORY DATA:  Her labs demonstrated a sodium of 137, potassium 4.0, chloride 104, bicarb 26, BUN 16, creatinine 0.92, blood sugar 91, white blood cells 4.1, hemoglobin 12.1, platelets 136.     Rosanna Randy, MD  CEM/MEDQ  D:  12/08/2010  T:  12/08/2010  Job:  [id number]  cc:   Lupita Raider, M.D. Fax: [phone]  Bufford Spikes, DO  Jesse Sans Wall, MD, FACC [address]  [address] [place] [address]  Electronically Signed by Vassie Loll MD on 12/18/2010 10:02:22 PM

## 2010-12-21 ENCOUNTER — Encounter: Payer: Self-pay | Admitting: Cardiology

## 2010-12-21 ENCOUNTER — Ambulatory Visit (INDEPENDENT_AMBULATORY_CARE_PROVIDER_SITE_OTHER): Payer: Medicare Other | Admitting: Cardiology

## 2010-12-21 VITALS — BP 146/82 | HR 68 | Ht 70.0 in | Wt 153.8 lb

## 2010-12-21 DIAGNOSIS — I498 Other specified cardiac arrhythmias: Secondary | ICD-10-CM

## 2010-12-21 DIAGNOSIS — R001 Bradycardia, unspecified: Secondary | ICD-10-CM

## 2010-12-21 DIAGNOSIS — I10 Essential (primary) hypertension: Secondary | ICD-10-CM

## 2010-12-21 NOTE — Progress Notes (Signed)
   [name] Briggs Date of Birth: November 23, 1926   History of Present Illness: [name] is seen for followup after hospitalization in April with syncope related to marked sinus bradycardia. Her heart rate was down to 33. Her beta blocker and Aricept were discontinued. Her bradycardia resolved. She was readmitted with an acute confusional episode. This has also resolved. She is now residing at [facility]. She denies any dizziness, syncope, shortness of breath, or chest pain. She feels that she is doing fairly well.  Current Outpatient Prescriptions on File Prior to Visit  Medication Sig Dispense Refill  . amLODipine (NORVASC) 5 MG tablet Take 5 mg by mouth daily.        . Calcium Carbonate-Vitamin D (CALCIUM + D PO) Take 600 mg by mouth 2 (two) times daily.        . citalopram (CELEXA) 20 MG tablet Take 20 mg by mouth daily.        Marland Kitchen levothyroxine (SYNTHROID, LEVOTHROID) 100 MCG tablet Take 100 mcg by mouth daily.        Marland Kitchen losartan (COZAAR) 50 MG tablet Take 50 mg by mouth 2 (two) times daily.        . ursodiol (ACTIGALL) 300 MG capsule Take 300 mg by mouth 2 (two) times daily.          Allergies  Allergen Reactions  . Ace Inhibitors     REACTION: Swelling  . Bystolic (Nebivolol Hcl) Other (See Comments)    Bradycardia  . Ciprofloxacin     REACTION: GI Upset    Past Medical History  Diagnosis Date  . Hypertension   . Alzheimer disease   . Depression   . Breast cancer   . Sleep apnea   . Hypothyroidism   . Anxiety   . Syncope and collapse   . Sinus bradycardia   . UTI (urinary tract infection)     ACUTE  . Dehydration     ACUTE  . Bradycardia     Past Surgical History  Procedure Date  . Cholecystectomy     History  Smoking status  . Former Smoker  . Quit date: 12/21/1987  Smokeless tobacco  . Not on file    History  Alcohol Use No    Family History  Problem Relation Age of Onset  . Heart failure Mother   . Parkinsonism Father     Review of  Systems: The review of systems is positive for slight lightheadedness if she gets up too quickly. She has had no palpitations.  All other systems were reviewed and are negative.  Physical Exam: BP 146/82  Pulse 68  Ht 5\' 10"  (1.778 m)  Wt 153 lb 12.8 oz (69.763 kg)  BMI 22.07 kg/m2 She is a thin elderly white female in no acute distress. Her HEENT exam is unremarkable. She has no JVD or bruits. Lungs are clear. Cardiac exam reveals a regular rate and rhythm without gallop, murmur, or click. Abdomen is soft and nontender. She has no masses or bruits. She has no edema. Pedal pulses are good. Neurologic exam she is alert and oriented x3. Cranial nerves II through XII are intact. LABORATORY DATA: ECG demonstrates normal sinus rhythm with a rate of 59 beats per minute. She has occasional PVCs. She has a right bundle branch block with left axis deviation. She has voltage criteria for LVH.  Assessment / Plan:

## 2010-12-21 NOTE — Assessment & Plan Note (Signed)
Blood pressures under acceptable control on amlodipine.

## 2010-12-21 NOTE — Patient Instructions (Signed)
Continue your current medications.  We will see you back as needed.

## 2010-12-21 NOTE — Assessment & Plan Note (Signed)
Her prior syncopal episode was related to marked bradycardia exacerbated by the beta blocker therapy. This has resolved. She should avoid beta blockers, non-dihydropyridine calcium channel blockers or digoxin in the future. We will see her back as needed.

## 2010-12-26 NOTE — Procedures (Signed)
NAMEDAVONA, KINOSHITA NO.:  [id number]   MEDICAL RECORD NO.:  [id number]          PATIENT TYPE:  OUT   LOCATION:  SLEEP CENTER                 FACILITY:  [facility]   PHYSICIAN:  Barbaraann Share, MD,FCCPDATE OF BIRTH:  10-21-28   DATE OF STUDY:  05/10/2008                            NOCTURNAL POLYSOMNOGRAM   REFERRING PHYSICIAN:  Barbaraann Share, MD,FCCP   INDICATION FOR STUDY:  Hypersomnia with sleep apnea.  The patient  returns for BiPAP titration.   EPWORTH SLEEPINESS SCORE:  3   MEDICATIONS:   SLEEP ARCHITECTURE:  The patient had total sleep time of 269 minutes  with no significant slow wave sleep and decreases REM.  Sleep onset  latency was normal at 3.5 minutes and REM onset was rapid at 45 minutes.  Sleep efficiency was decreased at 72%.   RESPIRATORY DATA:  The patient underwent a BiPAP titration study using a  small ResMed Quattro full face mask.  Her pressure was initiated at 8/4  and ultimately increased to 9/4 for breakthrough events.  At this level,  the patient continued to have a few obstructive events, but also began  to have induced centrals.  She finished the study at the pressure of 9/4  with an AHI during that time of 4.7 events per hour including the  centrals.   OXYGEN DATA:  There was transient O2 desaturation as low as 84% prior to  optimal CPAP.   CARDIAC DATA:  No clinically significant arrhythmias were noted.   MOVEMENT-PARASOMNIA:  There were no leg jerks or other significant  abnormalities seen.   IMPRESSIONS-RECOMMENDATIONS:  Good control of previously diagnosed  obstructive sleep apnea with a bilevel pressure of 9/4 through a small  ResMed Quattro full face mask.  I would recommend using 9/5 as her  optimal pressure due to breakthrough obstructive events.  The patient  should also be encourage to work aggressively on weight loss.     Barbaraann Share, MD,FCCP  Diplomate, American Board of Sleep  Medicine  Electronically  Signed    [facility]/MEDQ  D:  05/18/2008 15:02:19  T:  05/18/2008 15:37:34  Job:  [id number]

## 2010-12-27 ENCOUNTER — Emergency Department (HOSPITAL_COMMUNITY)
Admission: EM | Admit: 2010-12-27 | Discharge: 2010-12-27 | Disposition: A | Discharge status: Home / Self Care | Payer: Medicare Other | Attending: Emergency Medicine | Admitting: Emergency Medicine

## 2010-12-27 ENCOUNTER — Emergency Department (HOSPITAL_BASED_OUTPATIENT_CLINIC_OR_DEPARTMENT_OTHER): Admission: EM | Admit: 2010-12-27 | Payer: PRIVATE HEALTH INSURANCE | Source: Home / Self Care

## 2010-12-27 ENCOUNTER — Emergency Department (HOSPITAL_COMMUNITY): Payer: Medicare Other

## 2010-12-27 DIAGNOSIS — R5383 Other fatigue: Secondary | ICD-10-CM | POA: Insufficient documentation

## 2010-12-27 DIAGNOSIS — R509 Fever, unspecified: Secondary | ICD-10-CM | POA: Insufficient documentation

## 2010-12-27 DIAGNOSIS — I10 Essential (primary) hypertension: Secondary | ICD-10-CM | POA: Insufficient documentation

## 2010-12-27 DIAGNOSIS — F29 Unspecified psychosis not due to a substance or known physiological condition: Secondary | ICD-10-CM | POA: Insufficient documentation

## 2010-12-27 DIAGNOSIS — E039 Hypothyroidism, unspecified: Secondary | ICD-10-CM | POA: Insufficient documentation

## 2010-12-27 DIAGNOSIS — R5381 Other malaise: Secondary | ICD-10-CM | POA: Insufficient documentation

## 2010-12-27 LAB — DIFFERENTIAL
Basophils Absolute: 0 10*3/uL (ref 0.0–0.1)
Lymphocytes Relative: 48 % — ABNORMAL HIGH (ref 12–46)
Monocytes Absolute: 0.3 10*3/uL (ref 0.1–1.0)
Neutro Abs: 1.2 10*3/uL — ABNORMAL LOW (ref 1.7–7.7)
Neutrophils Relative %: 40 % — ABNORMAL LOW (ref 43–77)

## 2010-12-27 LAB — POCT I-STAT, CHEM 8
BUN: 14 mg/dL (ref 6–23)
Creatinine, Ser: 1 mg/dL (ref 0.4–1.2)
Hemoglobin: 11.6 g/dL — ABNORMAL LOW (ref 12.0–15.0)
Potassium: 3.8 mEq/L (ref 3.5–5.1)
Sodium: 140 mEq/L (ref 135–145)

## 2010-12-27 LAB — URINALYSIS, ROUTINE W REFLEX MICROSCOPIC
Glucose, UA: NEGATIVE mg/dL
Hgb urine dipstick: NEGATIVE
Protein, ur: NEGATIVE mg/dL
Specific Gravity, Urine: 1.011 (ref 1.005–1.030)
pH: 7.5 (ref 5.0–8.0)

## 2010-12-27 LAB — CBC
HCT: 33.6 % — ABNORMAL LOW (ref 36.0–46.0)
Hemoglobin: 11.1 g/dL — ABNORMAL LOW (ref 12.0–15.0)
MCHC: 33 g/dL (ref 30.0–36.0)
RBC: 3.75 MIL/uL — ABNORMAL LOW (ref 3.87–5.11)

## 2010-12-29 LAB — URINE CULTURE
Colony Count: 40000
Culture  Setup Time: 201205170118

## 2010-12-29 NOTE — Op Note (Signed)
NAME:  RAKEISHA, NYCE                          ACCOUNT NO.:  [id number]   MEDICAL RECORD NO.:  [id number]                   PATIENT TYPE:  AMB   LOCATION:  DSC                                  FACILITY:  [facility]   PHYSICIAN:  Currie Paris, M.D.           DATE OF BIRTH:  02/25/1927   DATE OF PROCEDURE:  10/26/2002  DATE OF DISCHARGE:                                 OPERATIVE REPORT   VISIT #[id number].  OFFICE MEDICAL RECORD #[id number].   PREOPERATIVE DIAGNOSES:  1. Breast cancer, left breast, upper outer quadrant.  2. Right breast cancer, lower inner quadrant.   OPERATION:  1. Needle-guided excision of left breast cancer, with blue dye injection and     sentinel lymph node biopsy.  2. Needle-guided excision of right breast cancer, with blue dye injection     and sentinel lymph node biopsy.   SURGEON:  Currie Paris, M.D.   ASSISTANT:  Geanie Berlin, P.A.-C.   ANESTHESIA:  General endotracheal.   INDICATIONS FOR PROCEDURE:  This patient is a 77 year old, originally found  to have a barely palpable right breast mass, and a mammographically-found  left breast mass, both of which showed carcinoma on core biopsies.  An MRI  showed no other lesions.  After a discussion of the alternatives, she  elected to proceed to bilateral lumpectomies with plans for postoperative  radiation.  We plan to do sentinel node evaluations at the time of this  surgery.   DESCRIPTION OF PROCEDURE:  The patient is seen in the holding area and had  no further questions.  She had already been injected with her radioactive  nucleotide and both cancers have been guide wire-localized.  The patient was  taken to the operating room.  After the satisfactory general anesthesia had  been obtained, I used our ultrasound to confirm the location of the tumors,  and marked the skin overlying them.  Both breasts and x-ray areas were  draped as a single field.  I started on the left side and made an  incision directly over the area of  the mass.  The guide wire entered laterally and tracked medially.  I used  cautery to take a wide excision of the tissue around the guide wire,  essentially down within 1.0 to 2.0 mm of chest wall.  Once I got this out, I  could palpate that the lesion appeared to be within the specimen but was  close along the medial deep area, so I took another 1.0 cm of tissue  directly from that area and sent it as a separate specimen.  This was sent  for specimen mammography which was negative.  It was sent for touch preps,  and there were some atypical cells at the margin, but it was right where we  had taken already additional margin.  While waiting for that report to come back, I made sure that everything was  dry.  I put four small clips to mark the periphery of the lumpectomy, and a  larger clip to mark the superficial and deep margins.  A pack was placed  while waiting for the final report from pathology.  Using a Neoprobe, I identified a hot area in the axilla and made a  transverse incision over that.  A self-retaining retractor was placed, and  the subcutaneous tissue divided, and I found a blue lymphatic, and tracing  that down and entering the axillary fat, was able to identify a few hot  lymph nodes, one of which had counts of around 1500, and the other had  counts of around 200.  These were both excised.  Once they were out, there  were no other hot areas in the axilla, and I saw no other blue nodes, and I  could not palpate any enlarged nodes.  These were sent for touch preps.  While waiting for pathology reports, I closed both incisions with #3-0  Vicryl, followed by #4-0 Monocryl subcuticular.  Both were injected with 10  mL of 0.25% plain Marcaine.  Gloves and instruments were changed, and attention was turned to the right  side.  This was done identical to the left, with the exception that I took a  small ellipse of skin over the tumor on the right  side.  This tumor was  located in the lower inner quadrant.  The guide wire here entered medially  and tracked laterally.  We went down again to within 1.0 mm of muscle, and  on palpation I thought the tumor was a little close deep, so I took some  more deep and medium margin, which was sent separately.  Likewise, we  achieved hemostasis, and while waiting for those reports, identified a hot  area in the right axilla.  I had injected 2.5 mL of Lymphazurin blue at the  start of each side, and that was done sub-areolarly.  I did find a small  blue lymphatic eventually, but this was a little harder to identify.  Once  identified, I traced it deeper into the axilla, and found a single hot node  that was bright blue and lay a little bit under the pectoralis muscle and a  little bit low in the axilla.  This was excised and had counts of around  1500.  The remaining background counts in the axilla were very low, under  50, and mostly around 10-12.  Hemostasis was achieved, and both of these incisions were closed with #3-0  Vicryl, followed by #4-0 Monocryl subcuticular.  Again, I injected Marcaine  in each, and again in the lumpectomy site marked with clips.  The pathology report on the right side reported that the sentinel node was  negative, and that the tumor had negative margins, with the exception of a  questionable close deep margin, and again that had been re-excised.  The  patient tolerated the procedure well.  There were no operative  complications.  All counts were correct.                                                  Currie Paris, M.D.    CJS/MEDQ  D:  10/26/2002  T:  10/26/2002  Job:  [id number]   cc:   Donia Guiles, M.D.  [address]  [place]  [place] [address]  Fax: [phone]   Vida Rigger, M.D.
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
## 2010-12-29 NOTE — Op Note (Signed)
[name] Briggs, [name] Briggs                ACCOUNT NO.:  [id number]   MEDICAL RECORD NO.:  [id number]          PATIENT TYPE:  AMB   LOCATION:  ENDO                         FACILITY:  [facility]   PHYSICIAN:  Petra Kuba, M.D.    DATE OF BIRTH:  [birth year]   DATE OF PROCEDURE:  03/01/2005  DATE OF DISCHARGE:                                 OPERATIVE REPORT   PROCEDURE:  Esophagogastroduodenoscopy with biopsy.   INDICATIONS FOR PROCEDURE:  Minimal upper tract symptoms, family history of  stomach cancer, cirrhosis, want to rule out varices.   Consent was signed after risks, benefits, methods, and options were  thoroughly discussed in the office and prior to any premedication. No  additional medicines were needed.   DESCRIPTION OF PROCEDURE:  The video endoscope was inserted by direct  vision. The esophagus was normal, there was no signs of varices. She did  have a small hiatal hernia with a widely patent and fibrous ring. The scope  passed easily into the stomach, advanced to the antrum pertinent for some  minimal antritis and advanced through a normal pylorus into a normal  duodenal bulb and around the C loop to a normal second portion of the  duodenum. The scope was withdrawn back to the bulb and a good look there  ruled out abnormalities in that location. The scope was withdrawn back to  the stomach and retroflexed. The angularis, cardia, fundus, lesser and  greater curve were evaluated on retroflexed and straight visualization and  other than some minimal gastritis no abnormalities were seen except for a  tiny distal lesser curve polyp which was cold biopsied x2. We went ahead and  took two biopsies of the antrum and two of the proximal stomach to rule out  Helicobacter and put them in the same container. No other obvious stomach  abnormality was seen specifically no ulcers, varices, portal gastropathy,  etc. Air was suctioned, scope slowly withdrawn. Again a good look at the  esophagus was  normal. The scope was removed. The patient tolerated the  procedure well and there was no obvious or immediate complication.   ENDOSCOPIC DIAGNOSES:  1.  Small hiatal hernia with a widely patent thin fibrous ring.  2.  Minimal gastritis status post biopsy.  3.  Tiny distal lesser curve polyp biopsied.  4.  Otherwise normal esophagogastroduodenoscopy without any varices or other      stigmata or cirrhosis were seen.   PLAN:  Await pathology, followup p.r.n. or in six months per our routine.       MEM/MEDQ  D:  03/01/2005  T:  03/01/2005  Job:  [id number]   cc:   Valentino Hue. Magrinat, M.D.  [address] [facility]  [place]  [place] [address]  Fax: [phone]   Donia Guiles, M.D.  [address] [place]  [place] [address]  Fax: [phone]
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
## 2010-12-29 NOTE — Op Note (Signed)
[name], [name]                ACCOUNT NO.:  [id number]   MEDICAL RECORD NO.:  [id number]          PATIENT TYPE:  AMB   LOCATION:  ENDO                         FACILITY:  [facility]   PHYSICIAN:  Petra Kuba, M.D.    DATE OF BIRTH:  05-15-1927   DATE OF PROCEDURE:  03/01/2005  DATE OF DISCHARGE:                                 OPERATIVE REPORT   PROCEDURE:  Colonoscopy with biopsy.   ENDOSCOPIST:  Petra Kuba, M.D.   INDICATIONS:  Patient with history of colon polyps due for repeat screening.  Consent was signed after risks, benefits, methods, options thoroughly  discussed multiple times in the past.   MEDICINES USED:  Demerol 60 mg, Versed 5 mg.   DESCRIPTION OF PROCEDURE:  Rectal inspection is pertinent for some small  external hemorrhoids.  Digital exam was negative.  Video regular colonoscope  was inserted and fairly easily advanced around the colon to the cecum. This  did require abdominal pressure and rolling her on her back.  On insertion  some left greater than rare right diverticula were seen but no other  abnormalities.  The cecum was identified by the appendiceal orifice and the  ileocecal valve.  The scope was slowly withdrawn.  The prep was fairly  adequate.  There was a little bit of residual stool adherent to the wall  which could not be sucked out or suctioned but on slow withdrawal through  the colon a tiny descending and hepatic flexure polyp was seen and were each  cold biopsied x1 or 2 and put in the same container.  The scope was further  withdrawn.  Other than the left greater than rare right diverticula, no  other abnormalities were seen as we slowly withdrew back to the rectum.  Anorectal pull-through and retroflexion confirmed some small hemorrhoids.  The scope was straightened readvanced a short ways up the left side of the  colon.  Air was suctioned, scope removed. The patient tolerated the  procedure well.  There was no obvious immediate  complication.   ENDOSCOPIC DIAGNOSES:  1.  Internal-external small hemorrhoids.  2.  Left greater than rare right diverticula.  3.  Two questionable tiny polyps ascending and hepatic flexure cold      biopsied.  4.  Otherwise within normal limits to the cecum.   PLAN:  Await pathology but consider repeating screening in five years if  doing very well medically.  Otherwise continue workup with a one-time EGD.       MEM/MEDQ  D:  03/01/2005  T:  03/01/2005  Job:  [id number]   cc:   Donia Guiles, M.D.  [address] [place]  [place] [address]  Fax: [phone]   Valentino Hue. Magrinat, M.D.  [address] [facility]  [place]  [place] [address]  Fax: [phone]

## 2010-12-29 NOTE — Op Note (Signed)
[place]  Patient:    [name] Briggs, [name] Briggs                       MRN: [id number] Proc. Date: 12/01/99 Adm. Date:  [id number] Disc. Date: [id number] Attending:  Marlowe Shores                           Operative Report  PREOPERATIVE DIAGNOSIS:  Dorsal ulnar wrist mass, right wrist.  POSTOPERATIVE DIAGNOSIS:  Dorsal ulnar wrist mass, right wrist.  OPERATION:  Excisional biopsy of mass, dorsal ulnar aspect, right wrist.  SURGEON:  Artist Pais. Mina Marble, M.D.  ANESTHESIA:  Bier block  TOURNIQUET TIME:  42 minutes.  COMPLICATIONS:  None.  DRAINS:  None.  SPECIMENS:  One specimen sent.  DESCRIPTION OF PROCEDURE:  The patient was taken to the operating room, and after the induction of adequate bier block anesthesia, the right upper extremity was prepped and draped in the usual sterile fashion.  Once this was done, a 4 cm incision was made over the dorsal ulnar border of wrist distal to the distal radial ulnar joint where a 2 x 3 cm palpable mass was felt.  The incision was taken longitudinally through the skin and subcutaneous tissue, with care to carefully identify and retract the dorsal sensory branch of the ulnar nerve.   After large dorsal vein has also been carefully retracted, a large cystic mass was encountered overlying the wrist capsule on the dorsal aspect of the right wrist.  Careful dissection down to the lower aspect revealed the ulnar artery and ulnar nerve to be in continuity with the cyst. This was carefully dissected free and retracted.  The cyst was then traced down to a stalk, excised in its entirely.  At this point in time, bipolar cautery was used to cauterize the base of the cyst.  The wound was thoroughly irrigated and then closed with a 4-0 nylon suture x 4 in a horizontal mattress fashion.  A sterile dressing with Xeroform, 4x4 fluffs, and ulnar splint was applied.  The patient tolerated the procedure well and went to the  recovery room in stable fashion. DD:  12/01/99 TD:  12/01/99 Job: [id number]

## 2010-12-29 NOTE — Procedures (Signed)
[facility]  Patient:    [name] Briggs, [name] Briggs                       MRN: [id number] Proc. Date: 02/27/00 Adm. Date:  [id number] Disc. Date: [id number] Attending:  Nelda Marseille CC:         Desma Maxim, M.D.                           Procedure Report  PROCEDURE:  Colonoscopy with biopsy.  INDICATIONS:  History of colon polyps due for colonic screening.  INFORMED CONSENT:  Consent was signed after risk, benefits, methods and options were thoroughly discussed multiple times in the past.  MEDICINES USED:  Demerol 50 mg, Versed 5 mg.  DESCRIPTION OF PROCEDURE:  Rectal inspection was pertinent for external hemorrhoids.  Digital exam was negative.  The video colonoscope was inserted and fairly easily advanced around the colon to the cecum.  This did require rolling her on her back and some abdominal pressure.  We did this in the ascending colon.  Cecum was identified by the appendiceal orifice and the ileocecal valve.  Prep was adequate.  There was some liquid stool that required washing and suctioning.  On slow withdrawal throughout the colon, a tiny polyp in the hepatic flexure was seen and cold biopsied x 1.  Three other tiny polyps, one in the descending, one in the distal sigmoid, and one in the rectum were all seen and cold biopsied and put in the same container.  Other than some left-sided diverticula, no other abnormalities were seen.  Once back in the rectum, the scope was retroflexed, pertinent for some internal hemorrhoids.  The scope was straightened and readvanced a short ways up the sigmoid.  Air was suctioned and the scope removed.  The patient tolerated the procedure well, and there was no obvious immediate complication.  ENDOSCOPIC DIAGNOSES: 1. Internal/external hemorrhoids. 2. Left-sided diverticula. 3. Four tiny questionable polyps in the rectum, sigmoid, descending, and    hepatic flexure, status post cold biopsy. 4. Otherwise  within normal limits to the cecum.  PLAN:  Await pathology but probably recheck colon in five years.  ______ p.r.n. or in two to three months to recheck liver tests because of her steatohepatitis.  Will return care to Dr. Arvilla Market.  With her episode of passing out, she had a pulse in the 40 preprocedure, during the procedure, and after the procedure, maybe he would want to do a Holter monitor or change her blood pressure medicines to see if that is playing a role too.  She needs a pacemaker. DD:  02/27/00 TD:  02/27/00 Job: [id number]

## 2011-03-19 ENCOUNTER — Other Ambulatory Visit: Payer: Self-pay | Admitting: Internal Medicine

## 2011-03-19 DIAGNOSIS — Z853 Personal history of malignant neoplasm of breast: Secondary | ICD-10-CM

## 2011-03-19 DIAGNOSIS — Z780 Asymptomatic menopausal state: Secondary | ICD-10-CM

## 2011-03-26 ENCOUNTER — Other Ambulatory Visit: Payer: Medicare Other

## 2011-03-26 ENCOUNTER — Ambulatory Visit
Admission: RE | Admit: 2011-03-26 | Discharge: 2011-03-26 | Disposition: A | Discharge status: Home / Self Care | Payer: Medicare Other | Source: Ambulatory Visit | Attending: Internal Medicine | Admitting: Internal Medicine

## 2011-03-26 DIAGNOSIS — Z853 Personal history of malignant neoplasm of breast: Secondary | ICD-10-CM

## 2011-04-02 ENCOUNTER — Emergency Department (HOSPITAL_COMMUNITY): Payer: Medicare Other

## 2011-04-02 ENCOUNTER — Encounter (HOSPITAL_COMMUNITY): Payer: Self-pay | Admitting: Radiology

## 2011-04-02 ENCOUNTER — Emergency Department (HOSPITAL_COMMUNITY)
Admission: EM | Admit: 2011-04-02 | Discharge: 2011-04-02 | Disposition: A | Discharge status: Home / Self Care | Payer: Medicare Other | Attending: Emergency Medicine | Admitting: Emergency Medicine

## 2011-04-02 DIAGNOSIS — S0990XA Unspecified injury of head, initial encounter: Secondary | ICD-10-CM | POA: Insufficient documentation

## 2011-04-02 DIAGNOSIS — E039 Hypothyroidism, unspecified: Secondary | ICD-10-CM | POA: Insufficient documentation

## 2011-04-02 DIAGNOSIS — G309 Alzheimer's disease, unspecified: Secondary | ICD-10-CM | POA: Insufficient documentation

## 2011-04-02 DIAGNOSIS — IMO0002 Reserved for concepts with insufficient information to code with codable children: Secondary | ICD-10-CM | POA: Insufficient documentation

## 2011-04-02 DIAGNOSIS — Z853 Personal history of malignant neoplasm of breast: Secondary | ICD-10-CM | POA: Insufficient documentation

## 2011-04-02 DIAGNOSIS — Y9210 Unspecified residential institution as the place of occurrence of the external cause: Secondary | ICD-10-CM | POA: Insufficient documentation

## 2011-04-02 DIAGNOSIS — W07XXXA Fall from chair, initial encounter: Secondary | ICD-10-CM | POA: Insufficient documentation

## 2011-04-02 DIAGNOSIS — Z79899 Other long term (current) drug therapy: Secondary | ICD-10-CM | POA: Insufficient documentation

## 2011-04-02 DIAGNOSIS — I10 Essential (primary) hypertension: Secondary | ICD-10-CM | POA: Insufficient documentation

## 2011-04-02 DIAGNOSIS — F068 Other specified mental disorders due to known physiological condition: Secondary | ICD-10-CM | POA: Insufficient documentation

## 2011-04-02 DIAGNOSIS — F0280 Dementia in other diseases classified elsewhere without behavioral disturbance: Secondary | ICD-10-CM | POA: Insufficient documentation

## 2011-04-02 LAB — URINALYSIS, ROUTINE W REFLEX MICROSCOPIC
Bilirubin Urine: NEGATIVE
Glucose, UA: NEGATIVE mg/dL
Hgb urine dipstick: NEGATIVE
Ketones, ur: NEGATIVE mg/dL
Protein, ur: NEGATIVE mg/dL
pH: 7.5 (ref 5.0–8.0)

## 2011-05-07 LAB — DIFFERENTIAL
Basophils Absolute: 0
Basophils Relative: 1
Eosinophils Absolute: 0.2
Eosinophils Relative: 4
Lymphocytes Relative: 31
Lymphs Abs: 1.7
Monocytes Absolute: 0.4
Monocytes Relative: 8
Neutro Abs: 3
Neutrophils Relative %: 56

## 2011-05-07 LAB — COMPREHENSIVE METABOLIC PANEL
Albumin: 4
BUN: 12
Calcium: 9.7
Creatinine, Ser: 0.78
Glucose, Bld: 111 — ABNORMAL HIGH
Total Protein: 7.7

## 2011-05-07 LAB — I-STAT 8, (EC8 V) (CONVERTED LAB)
Acid-Base Excess: 2
BUN: 14
Bicarbonate: 28 — ABNORMAL HIGH
HCT: 44
Hemoglobin: 15
Operator id: 285841
Sodium: 140
TCO2: 29

## 2011-05-07 LAB — CBC
HCT: 40.8
MCHC: 34.8
MCV: 89.6
Platelets: 138 — ABNORMAL LOW
RDW: 13.5

## 2011-05-07 LAB — POCT CARDIAC MARKERS
Myoglobin, poc: 37.9
Operator id: 285841
Troponin i, poc: <0.05

## 2011-05-07 LAB — POCT I-STAT CREATININE: Creatinine, Ser: 0.9

## 2011-05-16 ENCOUNTER — Emergency Department (HOSPITAL_COMMUNITY)
Admission: EM | Admit: 2011-05-16 | Discharge: 2011-05-18 | Disposition: A | Discharge status: Other Acute Inpatient Hospital | Payer: Medicare Other | Attending: Emergency Medicine | Admitting: Emergency Medicine

## 2011-05-16 ENCOUNTER — Emergency Department (HOSPITAL_COMMUNITY): Payer: Medicare Other

## 2011-05-16 DIAGNOSIS — G309 Alzheimer's disease, unspecified: Secondary | ICD-10-CM | POA: Insufficient documentation

## 2011-05-16 DIAGNOSIS — R4182 Altered mental status, unspecified: Secondary | ICD-10-CM | POA: Insufficient documentation

## 2011-05-16 DIAGNOSIS — Z853 Personal history of malignant neoplasm of breast: Secondary | ICD-10-CM | POA: Insufficient documentation

## 2011-05-16 DIAGNOSIS — F919 Conduct disorder, unspecified: Secondary | ICD-10-CM | POA: Insufficient documentation

## 2011-05-16 DIAGNOSIS — F0280 Dementia in other diseases classified elsewhere without behavioral disturbance: Secondary | ICD-10-CM | POA: Insufficient documentation

## 2011-05-16 DIAGNOSIS — I10 Essential (primary) hypertension: Secondary | ICD-10-CM | POA: Insufficient documentation

## 2011-05-16 DIAGNOSIS — I498 Other specified cardiac arrhythmias: Secondary | ICD-10-CM | POA: Insufficient documentation

## 2011-05-16 DIAGNOSIS — E039 Hypothyroidism, unspecified: Secondary | ICD-10-CM | POA: Insufficient documentation

## 2011-05-16 LAB — URINALYSIS, ROUTINE W REFLEX MICROSCOPIC
Bilirubin Urine: NEGATIVE
Leukocytes, UA: NEGATIVE
Nitrite: NEGATIVE
Specific Gravity, Urine: 1.01 (ref 1.005–1.030)
Urobilinogen, UA: 1 mg/dL (ref 0.0–1.0)
pH: 8 (ref 5.0–8.0)

## 2011-05-16 LAB — BASIC METABOLIC PANEL
BUN: 20 mg/dL (ref 6–23)
CO2: 25 mEq/L (ref 19–32)
Calcium: 9.5 mg/dL (ref 8.4–10.5)
Chloride: 104 mEq/L (ref 96–112)
Creatinine, Ser: 0.95 mg/dL (ref 0.50–1.10)
GFR calc Af Amer: 63 mL/min — ABNORMAL LOW (ref >90)
GFR calc non Af Amer: 55 mL/min — ABNORMAL LOW (ref >90)
Glucose, Bld: 83 mg/dL (ref 70–99)
Potassium: 3.6 mEq/L (ref 3.5–5.1)
Sodium: 140 mEq/L (ref 135–145)

## 2011-05-16 LAB — CBC
HCT: 34.1 % — ABNORMAL LOW (ref 36.0–46.0)
Hemoglobin: 11.5 g/dL — ABNORMAL LOW (ref 12.0–15.0)
MCH: 30.7 pg (ref 26.0–34.0)
MCHC: 33.7 g/dL (ref 30.0–36.0)
MCV: 91.2 fL (ref 78.0–100.0)
Platelets: 155 10*3/uL (ref 150–400)
RBC: 3.74 MIL/uL — ABNORMAL LOW (ref 3.87–5.11)
RDW: 13.3 % (ref 11.5–15.5)
WBC: 5.2 10*3/uL (ref 4.0–10.5)

## 2011-05-16 LAB — DIFFERENTIAL
Basophils Absolute: 0 10*3/uL (ref 0.0–0.1)
Basophils Relative: 1 % (ref 0–1)
Eosinophils Absolute: 0.1 10*3/uL (ref 0.0–0.7)
Eosinophils Relative: 2 % (ref 0–5)
Lymphocytes Relative: 35 % (ref 12–46)
Lymphs Abs: 1.8 10*3/uL (ref 0.7–4.0)
Monocytes Absolute: 0.4 10*3/uL (ref 0.1–1.0)
Monocytes Relative: 8 % (ref 3–12)
Neutro Abs: 2.8 10*3/uL (ref 1.7–7.7)
Neutrophils Relative %: 54 % (ref 43–77)

## 2011-05-16 LAB — POCT I-STAT TROPONIN I: Troponin i, poc: 0.02 ng/mL (ref 0.00–0.08)

## 2011-05-17 ENCOUNTER — Emergency Department (HOSPITAL_COMMUNITY): Payer: Medicare Other

## 2011-08-16 ENCOUNTER — Emergency Department (HOSPITAL_COMMUNITY): Payer: Medicare Other

## 2011-08-16 ENCOUNTER — Other Ambulatory Visit: Payer: Self-pay

## 2011-08-16 ENCOUNTER — Observation Stay (HOSPITAL_COMMUNITY)
Admission: EM | Admit: 2011-08-16 | Discharge: 2011-08-17 | Disposition: A | Discharge status: Nursing Home (Includes ICF) | Payer: Medicare Other | Attending: Internal Medicine | Admitting: Internal Medicine

## 2011-08-16 ENCOUNTER — Encounter (HOSPITAL_COMMUNITY): Payer: Self-pay

## 2011-08-16 DIAGNOSIS — F0280 Dementia in other diseases classified elsewhere without behavioral disturbance: Secondary | ICD-10-CM | POA: Insufficient documentation

## 2011-08-16 DIAGNOSIS — Z853 Personal history of malignant neoplasm of breast: Secondary | ICD-10-CM | POA: Insufficient documentation

## 2011-08-16 DIAGNOSIS — R079 Chest pain, unspecified: Secondary | ICD-10-CM | POA: Diagnosis present

## 2011-08-16 DIAGNOSIS — I10 Essential (primary) hypertension: Secondary | ICD-10-CM

## 2011-08-16 DIAGNOSIS — G4730 Sleep apnea, unspecified: Secondary | ICD-10-CM | POA: Insufficient documentation

## 2011-08-16 DIAGNOSIS — R0789 Other chest pain: Principal | ICD-10-CM | POA: Insufficient documentation

## 2011-08-16 DIAGNOSIS — F329 Major depressive disorder, single episode, unspecified: Secondary | ICD-10-CM | POA: Insufficient documentation

## 2011-08-16 DIAGNOSIS — F411 Generalized anxiety disorder: Secondary | ICD-10-CM | POA: Insufficient documentation

## 2011-08-16 DIAGNOSIS — F3289 Other specified depressive episodes: Secondary | ICD-10-CM | POA: Insufficient documentation

## 2011-08-16 DIAGNOSIS — E039 Hypothyroidism, unspecified: Secondary | ICD-10-CM | POA: Insufficient documentation

## 2011-08-16 DIAGNOSIS — G309 Alzheimer's disease, unspecified: Secondary | ICD-10-CM | POA: Insufficient documentation

## 2011-08-16 DIAGNOSIS — I498 Other specified cardiac arrhythmias: Secondary | ICD-10-CM | POA: Insufficient documentation

## 2011-08-16 DIAGNOSIS — R55 Syncope and collapse: Secondary | ICD-10-CM

## 2011-08-16 LAB — CARDIAC PANEL(CRET KIN+CKTOT+MB+TROPI)
CK, MB: 2.6 ng/mL (ref 0.3–4.0)
Relative Index: INVALID (ref 0.0–2.5)
Relative Index: INVALID (ref 0.0–2.5)
Troponin I: <0.3 ng/mL (ref <0.30)
Troponin I: <0.3 ng/mL (ref <0.30)

## 2011-08-16 LAB — POCT I-STAT, CHEM 8
Calcium, Ion: 1.14 mmol/L (ref 1.12–1.32)
Chloride: 103 mEq/L (ref 96–112)
HCT: 38 % (ref 36.0–46.0)
Sodium: 141 mEq/L (ref 135–145)
TCO2: 28 mmol/L (ref 0–100)

## 2011-08-16 LAB — CBC
HCT: 37.2 % (ref 36.0–46.0)
HCT: 36.1 % (ref 36.0–46.0)
Hemoglobin: 12.4 g/dL (ref 12.0–15.0)
MCH: 30.3 pg (ref 26.0–34.0)
MCHC: 33.3 g/dL (ref 30.0–36.0)
MCV: 90.3 fL (ref 78.0–100.0)
Platelets: 183 10*3/uL (ref 150–400)
RBC: 4.08 MIL/uL (ref 3.87–5.11)
RBC: 4 MIL/uL (ref 3.87–5.11)
WBC: 13 10*3/uL — ABNORMAL HIGH (ref 4.0–10.5)
WBC: 9.9 10*3/uL (ref 4.0–10.5)

## 2011-08-16 LAB — BASIC METABOLIC PANEL
BUN: 10 mg/dL (ref 6–23)
CO2: 26 mEq/L (ref 19–32)
Chloride: 102 mEq/L (ref 96–112)
Creatinine, Ser: 0.72 mg/dL (ref 0.50–1.10)
GFR calc Af Amer: >90 mL/min (ref >90)
Potassium: 3.8 mEq/L (ref 3.5–5.1)

## 2011-08-16 LAB — URINALYSIS, ROUTINE W REFLEX MICROSCOPIC
Bilirubin Urine: NEGATIVE
Nitrite: NEGATIVE
Protein, ur: NEGATIVE mg/dL
Specific Gravity, Urine: 1.007 (ref 1.005–1.030)
Urobilinogen, UA: 0.2 mg/dL (ref 0.0–1.0)

## 2011-08-16 LAB — DIFFERENTIAL
Basophils Relative: 0 % (ref 0–1)
Lymphocytes Relative: 9 % — ABNORMAL LOW (ref 12–46)
Lymphs Abs: 1.2 10*3/uL (ref 0.7–4.0)
Monocytes Absolute: 1.2 10*3/uL — ABNORMAL HIGH (ref 0.1–1.0)
Monocytes Relative: 9 % (ref 3–12)
Neutro Abs: 10.7 10*3/uL — ABNORMAL HIGH (ref 1.7–7.7)
Neutrophils Relative %: 82 % — ABNORMAL HIGH (ref 43–77)

## 2011-08-16 LAB — POCT I-STAT TROPONIN I: Troponin i, poc: 0 ng/mL (ref 0.00–0.08)

## 2011-08-16 LAB — MRSA PCR SCREENING: MRSA by PCR: NEGATIVE

## 2011-08-16 LAB — CREATININE, SERUM: GFR calc Af Amer: >90 mL/min (ref >90)

## 2011-08-16 MED ORDER — ZIPRASIDONE HCL 60 MG PO CAPS
60.0000 mg | ORAL_CAPSULE | ORAL | Status: AC
  Administered 2011-08-16: 60 mg via ORAL
  Filled 2011-08-16 (×2): qty 1

## 2011-08-16 MED ORDER — VENLAFAXINE HCL 37.5 MG PO TABS
37.5000 mg | ORAL_TABLET | Freq: Three times a day (TID) | ORAL | Status: DC
  Administered 2011-08-17: 37.5 mg via ORAL
  Filled 2011-08-16 (×4): qty 1

## 2011-08-16 MED ORDER — LOSARTAN POTASSIUM 25 MG PO TABS
25.0000 mg | ORAL_TABLET | Freq: Two times a day (BID) | ORAL | Status: DC
  Administered 2011-08-16 – 2011-08-17 (×2): 25 mg via ORAL
  Filled 2011-08-16 (×3): qty 1

## 2011-08-16 MED ORDER — QUETIAPINE FUMARATE 25 MG PO TABS
25.0000 mg | ORAL_TABLET | Freq: Every day | ORAL | Status: DC
  Administered 2011-08-16: 25 mg via ORAL
  Filled 2011-08-16 (×2): qty 1

## 2011-08-16 MED ORDER — ASPIRIN 325 MG PO TABS
325.0000 mg | ORAL_TABLET | Freq: Every day | ORAL | Status: DC
  Administered 2011-08-16 – 2011-08-17 (×2): 325 mg via ORAL
  Filled 2011-08-16 (×2): qty 1

## 2011-08-16 MED ORDER — SODIUM CHLORIDE 0.9 % IJ SOLN
3.0000 mL | Freq: Two times a day (BID) | INTRAMUSCULAR | Status: DC
  Administered 2011-08-16 – 2011-08-17 (×2): 3 mL via INTRAVENOUS

## 2011-08-16 MED ORDER — ACETAMINOPHEN 325 MG PO TABS: 650.0000 mg | ORAL_TABLET | Freq: Four times a day (QID) | ORAL | Status: DC | PRN

## 2011-08-16 MED ORDER — SODIUM CHLORIDE 0.9 % IV SOLN: 250.0000 mL | INTRAVENOUS | Status: DC | PRN

## 2011-08-16 MED ORDER — ZIPRASIDONE HCL 60 MG PO CAPS
60.0000 mg | ORAL_CAPSULE | Freq: Two times a day (BID) | ORAL | Status: DC
  Administered 2011-08-16 – 2011-08-17 (×2): 60 mg via ORAL
  Filled 2011-08-16 (×4): qty 1

## 2011-08-16 MED ORDER — ACETAMINOPHEN 650 MG RE SUPP: 650.0000 mg | Freq: Four times a day (QID) | RECTAL | Status: DC | PRN

## 2011-08-16 MED ORDER — ENOXAPARIN SODIUM 30 MG/0.3ML ~~LOC~~ SOLN
30.0000 mg | SUBCUTANEOUS | Status: DC
  Administered 2011-08-16: 30 mg via SUBCUTANEOUS
  Filled 2011-08-16 (×2): qty 0.3

## 2011-08-16 MED ORDER — SODIUM CHLORIDE 0.9 % IJ SOLN: 3.0000 mL | INTRAMUSCULAR | Status: DC | PRN

## 2011-08-16 MED ORDER — LEVOTHYROXINE SODIUM 100 MCG PO TABS
100.0000 ug | ORAL_TABLET | Freq: Every day | ORAL | Status: DC
  Administered 2011-08-17: 100 ug via ORAL
  Filled 2011-08-16 (×2): qty 1

## 2011-08-16 MED ORDER — LORAZEPAM 0.5 MG PO TABS
0.2500 mg | ORAL_TABLET | Freq: Every day | ORAL | Status: DC
  Administered 2011-08-17: 0.25 mg via ORAL
  Filled 2011-08-16 (×2): qty 1

## 2011-08-16 MED ORDER — LORAZEPAM 1 MG PO TABS
0.5000 mg | ORAL_TABLET | Freq: Once | ORAL | Status: AC
  Administered 2011-08-16: 0.5 mg via ORAL
  Filled 2011-08-16: qty 1

## 2011-08-16 MED ORDER — LORAZEPAM 0.5 MG PO TABS
0.5000 mg | ORAL_TABLET | Freq: Every evening | ORAL | Status: DC | PRN
  Administered 2011-08-16: 0.5 mg via ORAL
  Filled 2011-08-16: qty 1

## 2011-08-16 MED ORDER — URSODIOL 300 MG PO CAPS
300.0000 mg | ORAL_CAPSULE | Freq: Three times a day (TID) | ORAL | Status: DC
  Administered 2011-08-16 – 2011-08-17 (×2): 300 mg via ORAL
  Filled 2011-08-16 (×4): qty 1

## 2011-08-16 NOTE — ED Provider Notes (Signed)
Medical screening examination/treatment/procedure(s) were conducted as a shared visit with non-physician practitioner(s) and myself.  I personally evaluated the patient during the encounter  From nursing home, clutching chest this morning. Facility assumed chest pain. Demented, unable to give history.   RBBB with T wave inversions similar to previous.  Glynn Octave, MD 08/16/11 1815

## 2011-08-16 NOTE — ED Provider Notes (Signed)
History     CSN: [id number]  Arrival date & time 08/16/11  0865   First MD Initiated Contact with Patient 08/16/11 [phone]      Chief Complaint  Patient presents with  . Chest Pain    (Consider location/radiation/quality/duration/timing/severity/associated sxs/prior treatment) HPI Comments: Patient with a history of Alzheimers disease was brought by EMS to the ED from [facility].  EMS was told by NH staff that the patient was grabbing at her chest when she woke up this morning.  EMS gave patient one NG and 4 baby aspirin.  She is unable to give any sort of history because her Alzheimers disease.  No known vomiting.  Denies shortness of breath and doesn't appear to be short of breath.  The history is provided by the patient and the EMS personnel.    Past Medical History  Diagnosis Date  . Hypertension   . Alzheimer disease   . Depression   . Breast cancer   . Sleep apnea   . Hypothyroidism   . Anxiety   . Syncope and collapse   . Sinus bradycardia   . UTI (urinary tract infection)     ACUTE  . Dehydration     ACUTE  . Bradycardia     Past Surgical History  Procedure Date  . Cholecystectomy   . Breast surgery     bilteral mastectomy's    Family History  Problem Relation Age of Onset  . Heart failure Mother   . Parkinsonism Father     History  Substance Use Topics  . Smoking status: Former Smoker    Quit date: 12/21/1987  . Smokeless tobacco: Never Used  . Alcohol Use: No    OB History    Grav Para Term Preterm Abortions TAB SAB Ect Mult Living                  Review of Systems  Unable to perform ROS   Allergies  Ace inhibitors; Bystolic; and Ciprofloxacin  Home Medications   Current Outpatient Rx  Name Route Sig Dispense Refill  . CALCIUM + D PO Oral Take 600 mg by mouth 2 (two) times daily.      Marland Kitchen LEVOTHYROXINE SODIUM 100 MCG PO TABS Oral Take 100 mcg by mouth daily.      Marland Kitchen LORAZEPAM 0.5 MG PO TABS Oral Take 0.25 mg by mouth  daily.      Marland Kitchen LORAZEPAM 0.5 MG PO TABS Oral Take 0.5 mg by mouth at bedtime and may repeat dose one time if needed.      Marland Kitchen LOSARTAN POTASSIUM 25 MG PO TABS Oral Take 25 mg by mouth 2 (two) times daily.      . QUETIAPINE FUMARATE 25 MG PO TABS Oral Take 25 mg by mouth at bedtime.      Marland Kitchen URSODIOL 300 MG PO CAPS Oral Take 300 mg by mouth 3 (three) times daily.     . VENLAFAXINE HCL 37.5 MG PO TABS Oral Take 37.5 mg by mouth 3 (three) times daily.      Marland Kitchen VITAMIN B-12 1000 MCG PO TABS Oral Take 1,000 mcg by mouth 2 (two) times daily.      Marland Kitchen ZIPRASIDONE HCL 60 MG PO CAPS Oral Take 60 mg by mouth 2 (two) times daily with a meal.        BP 151/79  Pulse 66  Temp(Src) 98.4 F (36.9 C) (Oral)  Resp 18  SpO2 97%  Physical Exam  Constitutional:  She appears well-developed and well-nourished. No distress.  HENT:  Head: Normocephalic and atraumatic.  Cardiovascular: Normal rate, regular rhythm and normal heart sounds.   Pulmonary/Chest: Effort normal and breath sounds normal. No respiratory distress. She has no wheezes.  Abdominal: Soft. Bowel sounds are normal. She exhibits no distension and no mass. There is no tenderness. There is no rebound and no guarding.  Neurological: She is disoriented.  Skin: Skin is warm and dry. No rash noted. She is not diaphoretic.  Psychiatric: She has a normal mood and affect.    ED Course  Procedures (including critical care time)  Labs Reviewed  CBC - Abnormal; Notable for the following:    WBC 13.0 (*)    All other components within normal limits  DIFFERENTIAL - Abnormal; Notable for the following:    Neutrophils Relative 82 (*)    Neutro Abs 10.7 (*)    Lymphocytes Relative 9 (*)    Monocytes Absolute 1.2 (*)    All other components within normal limits  BASIC METABOLIC PANEL - Abnormal; Notable for the following:    Glucose, Bld 115 (*)    GFR calc non Af Amer 78 (*)    All other components within normal limits  POCT I-STAT, CHEM 8 - Abnormal;  Notable for the following:    Glucose, Bld 119 (*)    All other components within normal limits  URINALYSIS, ROUTINE W REFLEX MICROSCOPIC - Abnormal; Notable for the following:    pH 8.5 (*)    All other components within normal limits  CARDIAC PANEL(CRET KIN+CKTOT+MB+TROPI)  POCT I-STAT TROPONIN I  CARDIAC PANEL(CRET KIN+CKTOT+MB+TROPI)  I-STAT TROPONIN I   Dg Chest 2 View  08/16/2011  *RADIOLOGY REPORT*  Clinical Data: Chest pain.  CHEST - 2 VIEW  Comparison: Chest x-ray 05/17/2011.  Findings: The cardiac silhouette, mediastinal and hilar contours are stable.  The lungs demonstrate chronic emphysematous and bronchitic changes with areas of pulmonary scarring.  No definite acute overlying pulmonary process.  The bony thorax is intact.  IMPRESSION: Chronic lung changes without definite acute overlying pulmonary process.  Original Report Authenticated By: P. Loralie Champagne, M.D.     1. Chest pain   2. Alzheimer disease     4:38 PM Discussed patient with Dr. Jomarie Longs with [facility].  Patient will be admitted to [facility] and placed on 23 hour observation. 4:38 PM Attempted to call patient's son Onalee Hua.  Was unable to reach him. 4:38 PM Patient's sister is now in the room.  She is not sure what the patient's code status is.    2:00 PM Discussed findings with patient's son.  He does not think that she has a code status established.   Date: 08/16/2011  Rate: 77  Rhythm: sinus arrhythmia  QRS Axis: normal  Intervals: normal  ST/T Wave abnormalities: nonspecific T wave changes  Conduction Disutrbances:none  Narrative Interpretation:  RBBB unchanged from previous EKG.  Worsening T wave inversion  Old EKG Reviewed: changes    MDM  Unable to get a clear history from patient secondary to Alzheimer's Dementia.  Initial cardiac enzymes negative. Therefore, patient will be admitted to medicine for chest pain rule out.        Pascal Lux [facility] 08/16/11 1639

## 2011-08-16 NOTE — ED Notes (Signed)
Per ems- pt awoke this am with chest pain, no other symptoms. 12 lead showed Right bundle branch. 20g IV left hand. NAD noted at this time.

## 2011-08-16 NOTE — ED Notes (Signed)
4712-01 Ready 

## 2011-08-16 NOTE — ED Notes (Signed)
Pt also c/o left sided neck pain.

## 2011-08-16 NOTE — ED Notes (Signed)
Pt is from [facility] and plans to ret there at d/c, per her son.  Service line CSW will follow pt for d/c planning arrangements.

## 2011-08-16 NOTE — H&P (Addendum)
PCP:   No primary provider on file.   Chief Complaint:  Questionable chest discomfort  HPI: [name] is an 78 year old female with Alzheimer's dementia was a resident of [facility] at the dementia unit and he was in the usual state of health until this morning when she was noted by staff to be clenching her chest and reportedly told a staff member at the facility that she was having some chest discomfort. He was subsequently brought to the emergency room where she had an EKG which showed diffuse T-wave flattening in inferior and lateral leads which is unchanged from prior along with RBBB,  She denies any chest pain to me at this time or any other symptoms whatsoever. Her history is unremarkable reliable due to dementia, but does not appear in any distress. Per family no prior history of CAD/stress test/or cardiac cath., No recent surgeries, immobilization, shortness of breath. No fevers or chills, no wheezing noted per family  Allergies:   Allergies  Allergen Reactions  . Ace Inhibitors     REACTION: Swelling  . Bystolic (Nebivolol Hcl) Other (See Comments)    Bradycardia  . Ciprofloxacin     REACTION: GI Upset      Past Medical History  Diagnosis Date  . Hypertension   . Alzheimer disease   . Depression   . Breast cancer   . Sleep apnea   . Hypothyroidism   . Anxiety   . Syncope and collapse   . Sinus bradycardia   . UTI (urinary tract infection)     ACUTE  . Dehydration     ACUTE  . Bradycardia     Past Surgical History  Procedure Date  . Cholecystectomy   . Breast surgery     bilteral mastectomy's    Prior to Admission medications   Medication Sig Start Date End Date Taking? Authorizing Provider  Calcium Carbonate-Vitamin D (CALCIUM + D PO) Take 600 mg by mouth 2 (two) times daily.     Yes Historical Provider, MD  levothyroxine (SYNTHROID, LEVOTHROID) 100 MCG tablet Take 100 mcg by mouth daily.     Yes Historical Provider, MD  LORazepam (ATIVAN) 0.5  MG tablet Take 0.25 mg by mouth daily.     Yes Historical Provider, MD  LORazepam (ATIVAN) 0.5 MG tablet Take 0.5 mg by mouth at bedtime and may repeat dose one time if needed.     Yes Historical Provider, MD  losartan (COZAAR) 25 MG tablet Take 25 mg by mouth 2 (two) times daily.     Yes Historical Provider, MD  QUEtiapine (SEROQUEL) 25 MG tablet Take 25 mg by mouth at bedtime.     Yes Historical Provider, MD  ursodiol (ACTIGALL) 300 MG capsule Take 300 mg by mouth 3 (three) times daily.    Yes Historical Provider, MD  venlafaxine (EFFEXOR) 37.5 MG tablet Take 37.5 mg by mouth 3 (three) times daily.     Yes Historical Provider, MD  vitamin B-12 (CYANOCOBALAMIN) 1000 MCG tablet Take 1,000 mcg by mouth 2 (two) times daily.     Yes Historical Provider, MD  ziprasidone (GEODON) 60 MG capsule Take 60 mg by mouth 2 (two) times daily with a meal.     Yes Historical Provider, MD    Social History: Lives at an ALF (dementia unit), no history of alcohol use per family, they report that she was a former smoker quit about 25 years ago   Family History  Problem Relation Age of Onset  .  Heart failure Mother   . Parkinsonism Father     Review of Systems: Limited due to dementia Constitutional: Denies fever, chills, diaphoresis, appetite change and fatigue.  HEENT: Denies photophobia, eye pain, redness, hearing loss, ear pain, congestion, sore throat, rhinorrhea, sneezing, mouth sores, trouble swallowing, neck pain, neck stiffness and tinnitus.   Respiratory: Denies SOB, DOE, cough, chest tightness,  and wheezing.   Cardiovascular: Denies  palpitations and leg swelling.  Gastrointestinal: Denies nausea, vomiting, abdominal pain, diarrhea, constipation, blood in stool and abdominal distention.  Genitourinary: Denies dysuria, urgency, frequency, hematuria, flank pain and difficulty urinating.  Musculoskeletal: Denies myalgias, back pain, joint swelling, arthralgias and gait problem.  Skin: Denies pallor,  rash and wound.  Neurological: Denies dizziness, seizures, syncope, weakness, light-headedness, numbness and headaches.  Hematological: Denies adenopathy. Easy bruising, personal or family bleeding history  Psychiatric/Behavioral: Denies suicidal ideation, mood changes, nervousness, sleep disturbance and agitation   Physical Exam: Blood pressure 141/75, pulse 83, temperature 98.8 F (37.1 C), temperature source Oral, resp. rate 18, SpO2 98.00%. Gen. alert awake sitting in bed in no acute distress, alert to self only, engaged in nonsensical conversations with self and others around her. HEENT: Pupils equal reactive to light oral mucosa is moist and pink neck is supple with no JVD, thyromegaly CVS S1-S2 regular rate and rhythm no murmurs rubs or gallops Lungs clear auscultation bilaterally Abdomen soft nontender with normal bowel sounds no organomegaly Extremities no edema clubbing or cyanosis  Labs on Admission:  Results for orders placed during the hospital encounter of 08/16/11 (from the past 48 hour(s))  CBC     Status: Abnormal   Collection Time   08/16/11  8:46 AM      Component Value Range Comment   WBC 13.0 (*) 4.0 - 10.5 (K/uL)    RBC 4.08  3.87 - 5.11 (MIL/uL)    Hemoglobin 12.4  12.0 - 15.0 (g/dL)    HCT 04.5  40.9 - 81.1 (%)    MCV 91.2  78.0 - 100.0 (fL)    MCH 30.4  26.0 - 34.0 (pg)    MCHC 33.3  30.0 - 36.0 (g/dL)    RDW 91.4  78.2 - 95.6 (%)    Platelets 195  150 - 400 (K/uL)   DIFFERENTIAL     Status: Abnormal   Collection Time   08/16/11  8:46 AM      Component Value Range Comment   Neutrophils Relative 82 (*) 43 - 77 (%)    Neutro Abs 10.7 (*) 1.7 - 7.7 (K/uL)    Lymphocytes Relative 9 (*) 12 - 46 (%)    Lymphs Abs 1.2  0.7 - 4.0 (K/uL)    Monocytes Relative 9  3 - 12 (%)    Monocytes Absolute 1.2 (*) 0.1 - 1.0 (K/uL)    Eosinophils Relative 0  0 - 5 (%)    Eosinophils Absolute 0.0  0.0 - 0.7 (K/uL)    Basophils Relative 0  0 - 1 (%)    Basophils Absolute 0.0   0.0 - 0.1 (K/uL)   BASIC METABOLIC PANEL     Status: Abnormal   Collection Time   08/16/11  8:46 AM      Component Value Range Comment   Sodium 138  135 - 145 (mEq/L)    Potassium 3.8  3.5 - 5.1 (mEq/L)    Chloride 102  96 - 112 (mEq/L)    CO2 26  19 - 32 (mEq/L)    Glucose, Bld 115 (*)  70 - 99 (mg/dL)    BUN 10  6 - 23 (mg/dL)    Creatinine, Ser 1.61  0.50 - 1.10 (mg/dL)    Calcium 9.4  8.4 - 10.5 (mg/dL)    GFR calc non Af Amer 78 (*) >90 (mL/min)    GFR calc Af Amer >90  >90 (mL/min)   CARDIAC PANEL(CRET KIN+CKTOT+MB+TROPI)     Status: Normal   Collection Time   08/16/11  8:46 AM      Component Value Range Comment   Total CK 72  7 - 177 (U/L)    CK, MB 3.2  0.3 - 4.0 (ng/mL)    Troponin I <0.30  <0.30 (ng/mL)    Relative Index RELATIVE INDEX IS INVALID  0.0 - 2.5    POCT I-STAT, CHEM 8     Status: Abnormal   Collection Time   08/16/11  8:58 AM      Component Value Range Comment   Sodium 141  135 - 145 (mEq/L)    Potassium 3.8  3.5 - 5.1 (mEq/L)    Chloride 103  96 - 112 (mEq/L)    BUN 9  6 - 23 (mg/dL)    Creatinine, Ser 0.96  0.50 - 1.10 (mg/dL)    Glucose, Bld 045 (*) 70 - 99 (mg/dL)    Calcium, Ion 4.09  1.12 - 1.32 (mmol/L)    TCO2 28  0 - 100 (mmol/L)    Hemoglobin 12.9  12.0 - 15.0 (g/dL)    HCT 81.1  91.4 - 78.2 (%)   POCT I-STAT TROPONIN I     Status: Normal   Collection Time   08/16/11  8:58 AM      Component Value Range Comment   Troponin i, poc 0.00  0.00 - 0.08 (ng/mL)    Comment 3            URINALYSIS, ROUTINE W REFLEX MICROSCOPIC     Status: Abnormal   Collection Time   08/16/11 10:26 AM      Component Value Range Comment   Color, Urine YELLOW  YELLOW     APPearance CLEAR  CLEAR     Specific Gravity, Urine 1.007  1.005 - 1.030     pH 8.5 (*) 5.0 - 8.0     Glucose, UA NEGATIVE  NEGATIVE (mg/dL)    Hgb urine dipstick NEGATIVE  NEGATIVE     Bilirubin Urine NEGATIVE  NEGATIVE     Ketones, ur NEGATIVE  NEGATIVE (mg/dL)    Protein, ur NEGATIVE  NEGATIVE  (mg/dL)    Urobilinogen, UA 0.2  0.0 - 1.0 (mg/dL)    Nitrite NEGATIVE  NEGATIVE     Leukocytes, UA NEGATIVE  NEGATIVE  MICROSCOPIC NOT DONE ON URINES WITH NEGATIVE PROTEIN, BLOOD, LEUKOCYTES, NITRITE, OR GLUCOSE <1000 mg/dL.  CARDIAC PANEL(CRET KIN+CKTOT+MB+TROPI)     Status: Normal   Collection Time   08/16/11 12:10 PM      Component Value Range Comment   Total CK 69  7 - 177 (U/L)    CK, MB 2.6  0.3 - 4.0 (ng/mL)    Troponin I <0.30  <0.30 (ng/mL)    Relative Index RELATIVE INDEX IS INVALID  0.0 - 2.5      Radiological Exams on Admission: Dg Chest 2 View  08/16/2011  *RADIOLOGY REPORT*  Clinical Data: Chest pain.  CHEST - 2 VIEW  Comparison: Chest x-ray 05/17/2011.  Findings: The cardiac silhouette, mediastinal and hilar contours are stable.  The lungs demonstrate chronic  emphysematous and bronchitic changes with areas of pulmonary scarring.  No definite acute overlying pulmonary process.  The bony thorax is intact.  IMPRESSION: Chronic lung changes without definite acute overlying pulmonary process.  Original Report Authenticated By: P. Loralie Champagne, M.D.    Assessment/Plan 1. Atypical chest pain: currently appears to have resolved, will admit her overnight for 23 hour observation to rule out arrhythmias, and check serial cardiac enzymes, EKG unchanged from prior, Will start her on a baby aspirin. I do not suspect a PE since her symptoms have resolved and does not demonstrate any evidence of tachycardia or hypoxia. I have discussed her care with her son Dayelin Balducci, he is agreeable to treat her conservatively and avoiding aggressive diagnostic workup given her age and advanced dementia. I will repeat an EKG in a.m., if stable and no events on telemetry we'll discharge her back to her facility tomorrow. 2. advanced dementia: Stable at baseline continue home medications, it to be careful with her antipsychotics in terms of QTC prolongation 3. hypertension stable continue losartan. 4. CODE  STATUS was discussed with son and power of attorney, she will be a DO NOT RESUSCITATE  Time Spent on Admission:  Samir Ishaq [facility] Pager: [phone] 08/16/2011, 3:55 PM

## 2011-08-17 ENCOUNTER — Telehealth: Payer: Self-pay | Admitting: Clinical

## 2011-08-17 ENCOUNTER — Other Ambulatory Visit: Payer: Self-pay

## 2011-08-17 LAB — CARDIAC PANEL(CRET KIN+CKTOT+MB+TROPI): Total CK: 57 U/L (ref 7–177)

## 2011-08-17 LAB — BASIC METABOLIC PANEL
BUN: 12 mg/dL (ref 6–23)
CO2: 25 mEq/L (ref 19–32)
Chloride: 101 mEq/L (ref 96–112)
Creatinine, Ser: 0.74 mg/dL (ref 0.50–1.10)
GFR calc Af Amer: 89 mL/min — ABNORMAL LOW (ref >90)
Glucose, Bld: 109 mg/dL — ABNORMAL HIGH (ref 70–99)
Potassium: 3.3 mEq/L — ABNORMAL LOW (ref 3.5–5.1)

## 2011-08-17 LAB — CBC
HCT: 33.1 % — ABNORMAL LOW (ref 36.0–46.0)
Hemoglobin: 11.2 g/dL — ABNORMAL LOW (ref 12.0–15.0)
MCV: 90.2 fL (ref 78.0–100.0)
RBC: 3.67 MIL/uL — ABNORMAL LOW (ref 3.87–5.11)
WBC: 7.6 10*3/uL (ref 4.0–10.5)

## 2011-08-17 MED ORDER — LORAZEPAM 0.5 MG PO TABS: 0.2500 mg | ORAL_TABLET | Freq: Every day | ORAL | Status: AC

## 2011-08-17 MED ORDER — ASPIRIN 81 MG PO TBEC: 81.0000 mg | DELAYED_RELEASE_TABLET | Freq: Every day | ORAL | Status: AC

## 2011-08-17 MED ORDER — LORAZEPAM 0.5 MG PO TABS: 0.5000 mg | ORAL_TABLET | Freq: Every evening | ORAL | Status: AC | PRN

## 2011-08-17 NOTE — Telephone Encounter (Signed)
This covering CSW was informed by [facility] that pt dc'd without a script for Ativan. CSW followed up with MD who informed CSW that scripts were sent to floor however pt had already dc'd. CSW was informed by facility that okay for CSW to make a copy of scripts and fax over to facility while also putting scripts in the mail. This CSW confirmed fax of scripts to facility and has put scripts in the mail to facility.  Theresia Bough, MSW, Theresia Majors [phone]

## 2011-08-17 NOTE — Discharge Summary (Signed)
Physician Discharge Summary  Patient ID: LAQUINDA MOLLER MRN: [id number] DOB/AGE: 10/27/1926 78 y.o.  Admit date: 08/16/2011 Discharge date: 08/17/2011  Primary Care Physician:  No primary provider on file.  Discharge Diagnoses:   1. Atypical chest pain-resolved 78 2. Alzheimer disease  3. Depression 4. Breast cancer  5. Sleep apnea   6. Hypothyroidism 7. Anxiety   8. Sinus bradycardia    Current Discharge Medication List    START taking these medications   Details  aspirin (ASPIRIN EC) 81 MG EC tablet Take 1 tablet (81 mg total) by mouth daily. Swallow whole. Qty: 30 tablet, Refills: 12      CONTINUE these medications which have NOT CHANGED   Details  Calcium Carbonate-Vitamin D (CALCIUM + D PO) Take 600 mg by mouth 2 (two) times daily.      levothyroxine (SYNTHROID, LEVOTHROID) 100 MCG tablet Take 100 mcg by mouth daily.     !! LORazepam (ATIVAN) 0.5 MG tablet Take 0.25 mg by mouth daily.      !! LORazepam (ATIVAN) 0.5 MG tablet Take 0.5 mg by mouth at bedtime and may repeat dose one time if needed.      losartan (COZAAR) 25 MG tablet Take 25 mg by mouth 2 (two) times daily.      QUEtiapine (SEROQUEL) 25 MG tablet Take 25 mg by mouth at bedtime.      ursodiol (ACTIGALL) 300 MG capsule Take 300 mg by mouth 3 (three) times daily.     venlafaxine (EFFEXOR) 37.5 MG tablet Take 37.5 mg by mouth 3 (three) times daily.      vitamin B-12 (CYANOCOBALAMIN) 1000 MCG tablet Take 1,000 mcg by mouth 2 (two) times daily.      ziprasidone (GEODON) 60 MG capsule Take 60 mg by mouth 2 (two) times daily with a meal.       !! - Potential duplicate medications found. Please discuss with provider.       Disposition and Follow-up:  PCP in one week  Consults: None  Significant Diagnostic Studies:  Dg Chest 2 View  08/16/2011  *RADIOLOGY REPORT*  Clinical Data: Chest pain.  CHEST - 2 VIEW  Comparison: Chest x-ray 05/17/2011.  Findings: The cardiac silhouette, mediastinal and hilar  contours are stable.  The lungs demonstrate chronic emphysematous and bronchitic changes with areas of pulmonary scarring.  No definite acute overlying pulmonary process.  The bony thorax is intact.  IMPRESSION: Chronic lung changes without definite acute overlying pulmonary process.  Original Report Authenticated By: P. Loralie Champagne, M.D.    Brief H and P: Ms. Corella is a 78 year old white female with Alzheimer's dementia from a facility, was brought in to the emergency room for evaluation since she was reportedly seen clenching her chest yesterday morning. By the time she presented to the emergency room, she did not complain of chest pain or shortness of breath, or any other symptoms whatsoever, and was admitted for observation  Hospital Course:  Atypical chest pain: This had pretty much resolved by the time of presentation to the emergency room, she had nonspecific changes on her EKG which were unchanged from prior, she was admitted subsequently ruled out for MI based on cardiac markers, did not have any events on telemetry. She had an unremarkable chest x-ray, and did not have any evidence of further chest pain, tachycardia or hypoxia to warrant an evaluation for PE. Due to her age, dementia and no prior history of CAD we did not want to pursue an aggressive workup this  was discussed with and agreed upon with her son and power of attorney Chai Verdejo. He is being discharged back to [facility] today in a stable condition.  Time spent on Discharge:  Signed: Ashia Dehner [facility]  08/17/2011, 11:58 AM

## 2011-11-07 ENCOUNTER — Encounter (HOSPITAL_COMMUNITY): Payer: Self-pay | Admitting: *Deleted

## 2011-11-07 ENCOUNTER — Emergency Department (HOSPITAL_COMMUNITY)
Admission: EM | Admit: 2011-11-07 | Discharge: 2011-11-07 | Disposition: A | Discharge status: Skilled Nursing Facility | Payer: Medicare Other | Attending: Emergency Medicine | Admitting: Emergency Medicine

## 2011-11-07 ENCOUNTER — Emergency Department (HOSPITAL_COMMUNITY): Payer: Medicare Other

## 2011-11-07 ENCOUNTER — Other Ambulatory Visit: Payer: Self-pay

## 2011-11-07 DIAGNOSIS — Z79899 Other long term (current) drug therapy: Secondary | ICD-10-CM | POA: Insufficient documentation

## 2011-11-07 DIAGNOSIS — G309 Alzheimer's disease, unspecified: Secondary | ICD-10-CM | POA: Insufficient documentation

## 2011-11-07 DIAGNOSIS — M79609 Pain in unspecified limb: Secondary | ICD-10-CM | POA: Insufficient documentation

## 2011-11-07 DIAGNOSIS — F0280 Dementia in other diseases classified elsewhere without behavioral disturbance: Secondary | ICD-10-CM | POA: Insufficient documentation

## 2011-11-07 DIAGNOSIS — I10 Essential (primary) hypertension: Secondary | ICD-10-CM | POA: Insufficient documentation

## 2011-11-07 DIAGNOSIS — G5630 Lesion of radial nerve, unspecified upper limb: Secondary | ICD-10-CM | POA: Insufficient documentation

## 2011-11-07 DIAGNOSIS — M21339 Wrist drop, unspecified wrist: Secondary | ICD-10-CM | POA: Insufficient documentation

## 2011-11-07 DIAGNOSIS — Z7982 Long term (current) use of aspirin: Secondary | ICD-10-CM | POA: Insufficient documentation

## 2011-11-07 DIAGNOSIS — E039 Hypothyroidism, unspecified: Secondary | ICD-10-CM | POA: Insufficient documentation

## 2011-11-07 DIAGNOSIS — Z853 Personal history of malignant neoplasm of breast: Secondary | ICD-10-CM | POA: Insufficient documentation

## 2011-11-07 DIAGNOSIS — F341 Dysthymic disorder: Secondary | ICD-10-CM | POA: Insufficient documentation

## 2011-11-07 DIAGNOSIS — R209 Unspecified disturbances of skin sensation: Secondary | ICD-10-CM | POA: Insufficient documentation

## 2011-11-07 LAB — BASIC METABOLIC PANEL
BUN: 15 mg/dL (ref 6–23)
CO2: 29 mEq/L (ref 19–32)
Chloride: 104 mEq/L (ref 96–112)
Glucose, Bld: 88 mg/dL (ref 70–99)
Potassium: 3.9 mEq/L (ref 3.5–5.1)

## 2011-11-07 LAB — CBC
HCT: 34 % — ABNORMAL LOW (ref 36.0–46.0)
Hemoglobin: 11.2 g/dL — ABNORMAL LOW (ref 12.0–15.0)
MCV: 92.6 fL (ref 78.0–100.0)
WBC: 4.5 10*3/uL (ref 4.0–10.5)

## 2011-11-07 NOTE — ED Notes (Signed)
Pt sent here from [name] with c/o right hand pain earlier this am.  Pt advises her right hand starting hurting when she was eating breakfast this am, pt denies any pain currently.  Pt. Has Hx of alzheimer's and is normally confused per EMS.

## 2011-11-07 NOTE — ED Provider Notes (Signed)
I was the primary provider of this patient during this ER visit. The patients care was continued in the CDU and managed in conjunction with my midlevel providers   Lyanne Co, MD 11/07/11 [phone]

## 2011-11-07 NOTE — ED Notes (Signed)
[facility] HAS TRANSPORTEDPT

## 2011-11-07 NOTE — ED Provider Notes (Signed)
This patient initially seen by Dr. Patria Mane presents for concerns about right wrist drop. Nursing home concerned about a stroke, however, it seems more suspicious for a radial nerve palsy. MRI ordered to r/o stroke. MRI is normal for acute abnormalities. Wrist splint ordered for patient. The patient can follow-up with Dr. Bufford Spikes, PCP for further management. I have discussed the results and plan with the patietns family members who verbalize their understanding and are agreeable with plan.  Dorthula Matas, PA 11/07/11 1353

## 2011-11-07 NOTE — ED Notes (Signed)
[facility] CALLED TO TRANSPORT PT 

## 2011-11-07 NOTE — ED Provider Notes (Signed)
History     CSN: [id number]  Arrival date & time 11/07/11  1049   First MD Initiated Contact with Patient 11/07/11 1106      Chief Complaint  Patient presents with  . Hand Pain    The history is provided by the nursing home and a relative. History Limited By: Dementia. level V caveat.   as reported that the patient awoke with numbness tingling and a new right wrist drop.  Family was notified as was the primary care Dr. and the patient was sent to the ER to evaluate for possible stroke.  The patient has a history of dementia and the family thinks that she may have had small strokes before in the past.  The patient's son reports that she is a slim mental status at this time has otherwise been doing well over the past several days.  There is no noted facial asymmetry or change in her speech pattern.  The patient is on an Alzheimer's unit at a nursing home.  Past Medical History  Diagnosis Date  . Hypertension   . Alzheimer disease   . Depression   . Breast cancer   . Sleep apnea   . Hypothyroidism   . Anxiety   . Syncope and collapse   . Sinus bradycardia   . UTI (urinary tract infection)     ACUTE  . Dehydration     ACUTE  . Bradycardia     Past Surgical History  Procedure Date  . Cholecystectomy   . Breast surgery     bilteral mastectomy's    Family History  Problem Relation Age of Onset  . Heart failure Mother   . Parkinsonism Father     History  Substance Use Topics  . Smoking status: Former Smoker    Quit date: 12/21/1987  . Smokeless tobacco: Never Used  . Alcohol Use: No    OB History    Grav Para Term Preterm Abortions TAB SAB Ect Mult Living                  Review of Systems  Unable to perform ROS   Allergies  Ace inhibitors; Bystolic; and Ciprofloxacin  Home Medications   Current Outpatient Rx  Name Route Sig Dispense Refill  . ASPIRIN 81 MG PO TBEC Oral Take 1 tablet (81 mg total) by mouth daily. Swallow whole. 30 tablet 12  . CALCIUM  + D PO Oral Take 600 mg by mouth 2 (two) times daily.      Marland Kitchen LEVOTHYROXINE SODIUM 100 MCG PO TABS Oral Take 100 mcg by mouth daily.     Marland Kitchen LORAZEPAM 0.5 MG PO TABS Oral Take 0.5 mg by mouth every 6 (six) hours as needed. For agitation    . LOSARTAN POTASSIUM 25 MG PO TABS Oral Take 25 mg by mouth 2 (two) times daily.      . QUETIAPINE FUMARATE 25 MG PO TABS Oral Take 25 mg by mouth at bedtime.      Marland Kitchen RIVASTIGMINE 4.6 MG/24HR TD PT24 Transdermal Place 1 patch onto the skin daily.    Marland Kitchen URSODIOL 300 MG PO CAPS Oral Take 300 mg by mouth 3 (three) times daily.     . VENLAFAXINE HCL 37.5 MG PO TABS Oral Take 37.5 mg by mouth 3 (three) times daily.      Marland Kitchen VITAMIN B-12 1000 MCG PO TABS Oral Take 1,000 mcg by mouth 2 (two) times daily.      Marland Kitchen ZIPRASIDONE HCL 60  MG PO CAPS Oral Take 60 mg by mouth 2 (two) times daily with a meal.        BP 124/66  Pulse 59  Temp(Src) 98.1 F (36.7 C) (Oral)  Resp 14  SpO2 99%  Physical Exam  Nursing note and vitals reviewed. Constitutional: She appears well-developed and well-nourished. No distress.  HENT:  Head: Normocephalic and atraumatic.  Eyes: EOM are normal.  Neck: Normal range of motion.  Cardiovascular: Normal rate, regular rhythm and normal heart sounds.   Pulmonary/Chest: Effort normal and breath sounds normal.  Abdominal: Soft. She exhibits no distension. There is no tenderness.  Musculoskeletal: Normal range of motion.  Neurological: She is alert.       Oriented to person but not place or time.  5 out of 5 strength in major muscle groups of bilateral upper and lower extremities except for her right wrist which demonstrates evidence of wrist drop.  Speech is normal.  No facial asymmetry.  Skin: Skin is warm and dry.  Psychiatric: She has a normal mood and affect. Judgment normal.    ED Course  Procedures (including critical care time)  Labs Reviewed  CBC - Abnormal; Notable for the following:    RBC 3.67 (*)    Hemoglobin 11.2 (*)    HCT  34.0 (*)    Platelets 133 (*)    All other components within normal limits  BASIC METABOLIC PANEL - Abnormal; Notable for the following:    GFR calc non Af Amer 63 (*)    GFR calc Af Amer 73 (*)    All other components within normal limits   Mr Brain Wo Contrast  11/07/2011  *RADIOLOGY REPORT*  Clinical Data: Right arm weakness.  MRI HEAD WITHOUT CONTRAST  Technique:  Multiplanar, multiecho pulse sequences of the brain and surrounding structures were obtained according to standard protocol without intravenous contrast.  Comparison: 05/16/2011 CT.  03/03/2010 MR.  Findings: No acute infarct.  Remote left cerebellar infarct.  No intracranial hemorrhage.  Global atrophy without hydrocephalus.  No intracranial mass lesion noted on this unenhanced exam.  Major intracranial vascular structures are patent.  Minimal ethmoid sinus air cell mucosal thickening.  Mild cervical spondylotic changes.  IMPRESSION: No acute infarct.  Please see above.  Original Report Authenticated By: Fuller Canada, M.D.     1. 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  There is some concern by the primary care Dr. the nursing home and the patient's family that this may represent stroke.  My suspicion is low however an MRI scan will be obtained to further evaluate as the patient has a history of dementia and therefore is not a good historian.  I do not find any other focal neuro deficit on her.  Likely home with a cockup wrist splint and PCP followup  The patient's care is to be completed in the clinical decision unit          Lyanne Co, MD 11/07/11 1550

## 2011-11-07 NOTE — Progress Notes (Signed)
Orthopedic Tech Progress Note Patient Details:  [name] 02/28/1927 [id number]  Type of Splint: Other (comment) (velcro wrist splint) Splint Location: right wrist Splint Interventions: Application    Nikki Dom 11/07/2011, 2:33 PM

## 2011-11-07 NOTE — Discharge Instructions (Signed)
Pt is to wear wrist splint until otherwise directed by another Physician.  Radial Nerve Palsy Wrist drop is also known as radial nerve palsy. It is a condition in which you can not extend your wrist. This means if you are standing with your elbow bent at a right angle and with the top of your hand pointed at the ceiling, you can not hold your hand up. It falls toward the floor.  This action of extending your wrist is caused by the muscles in the back of your arm. These muscles are controlled by the radial nerve. This means that anything affecting the radial nerve so it can not tell the muscles how to work will cause wrist drop. This is medically called radial nerve palsy. Also the radial nerve is a motor and sensory nerve so anything affecting it causes problems with movement and feeling. CAUSES  Some more common causes of wrist drop are:  A break (fracture) of the large bone in the arm between your shoulder and your elbow (humerus). This is because the radial nerve winds around the humerus.   Improper use of crutches causes this because the radial nerve runs through the armpit (axilla). Crutches which are too long can put pressure on the nerve. This is sometimes called crutch palsy.   Falling asleep with your arm over a chair and supported on the back is a common cause. This is sometimes called Saturday Night Syndrome.   Wrist drop can be associated with lead poisoning because of the effect of lead on the radial nerve.  SYMPTOMS  The wrist drop is an obvious problem, but there may also be numbness in the back of the arm, forearm or hand which provides feeling in these areas by the radial nerve. There can be difficulty straightening out the elbow in addition to the wrist. There may be numbness, tingling, pain, burning sensations or other abnormal feelings. Symptoms depend entirely on where the radial nerve is injured. DIAGNOSIS   Wrist drop is obvious just by looking at it. Your caregiver may make  the diagnosis by taking your history and doing a couple tests.   One test which may be done is a nerve conduction study. This test shows if the radial nerve is conducting signals well. If not, it can determine where the nerve problem is.   Sometimes X-ray studies are done. Your caregiver will determine if further testing needs to be done.  TREATMENT   Usually if the problem is found to be pressure on the nerve, simply removing the pressure will allow the nerve to go back to normal in a few weeks to a few months. Other treatments will depend upon the cause found.   Only take over-the-counter or prescription medicines for pain, discomfort, or fever as directed by your caregiver.   Sometimes seizure medications are used.   Steroids are sometimes given to decrease swelling if it is thought to be a possible cause.  Document Released: 04/05/2006 Document Revised: 07/19/2011 Document Reviewed: 05/16/2006 [facility] Patient Information 2012 [place], [place].

## 2012-12-04 ENCOUNTER — Other Ambulatory Visit: Payer: Self-pay | Admitting: *Deleted

## 2012-12-04 MED ORDER — SENNOSIDES-DOCUSATE SODIUM 8.6-50 MG PO TABS: ORAL_TABLET | ORAL | Status: DC

## 2012-12-08 ENCOUNTER — Other Ambulatory Visit: Payer: Self-pay | Admitting: *Deleted

## 2012-12-25 ENCOUNTER — Ambulatory Visit: Payer: Self-pay | Admitting: Internal Medicine

## 2013-02-02 ENCOUNTER — Encounter (HOSPITAL_COMMUNITY): Payer: Self-pay | Admitting: Nurse Practitioner

## 2013-02-02 ENCOUNTER — Emergency Department (HOSPITAL_COMMUNITY)
Admission: EM | Admit: 2013-02-02 | Discharge: 2013-02-02 | Disposition: A | Discharge status: Home / Self Care | Payer: Medicare Other | Attending: Emergency Medicine | Admitting: Emergency Medicine

## 2013-02-02 ENCOUNTER — Emergency Department (HOSPITAL_COMMUNITY): Payer: Medicare Other

## 2013-02-02 DIAGNOSIS — F3289 Other specified depressive episodes: Secondary | ICD-10-CM | POA: Insufficient documentation

## 2013-02-02 DIAGNOSIS — Z79899 Other long term (current) drug therapy: Secondary | ICD-10-CM | POA: Insufficient documentation

## 2013-02-02 DIAGNOSIS — Z8719 Personal history of other diseases of the digestive system: Secondary | ICD-10-CM | POA: Insufficient documentation

## 2013-02-02 DIAGNOSIS — E039 Hypothyroidism, unspecified: Secondary | ICD-10-CM | POA: Insufficient documentation

## 2013-02-02 DIAGNOSIS — Z87440 Personal history of urinary (tract) infections: Secondary | ICD-10-CM | POA: Insufficient documentation

## 2013-02-02 DIAGNOSIS — Z043 Encounter for examination and observation following other accident: Secondary | ICD-10-CM | POA: Insufficient documentation

## 2013-02-02 DIAGNOSIS — F329 Major depressive disorder, single episode, unspecified: Secondary | ICD-10-CM | POA: Insufficient documentation

## 2013-02-02 DIAGNOSIS — Y9289 Other specified places as the place of occurrence of the external cause: Secondary | ICD-10-CM | POA: Insufficient documentation

## 2013-02-02 DIAGNOSIS — W19XXXA Unspecified fall, initial encounter: Secondary | ICD-10-CM | POA: Insufficient documentation

## 2013-02-02 DIAGNOSIS — Z8669 Personal history of other diseases of the nervous system and sense organs: Secondary | ICD-10-CM | POA: Insufficient documentation

## 2013-02-02 DIAGNOSIS — Z8679 Personal history of other diseases of the circulatory system: Secondary | ICD-10-CM | POA: Insufficient documentation

## 2013-02-02 DIAGNOSIS — G309 Alzheimer's disease, unspecified: Secondary | ICD-10-CM | POA: Insufficient documentation

## 2013-02-02 DIAGNOSIS — I10 Essential (primary) hypertension: Secondary | ICD-10-CM | POA: Insufficient documentation

## 2013-02-02 DIAGNOSIS — F0280 Dementia in other diseases classified elsewhere without behavioral disturbance: Secondary | ICD-10-CM | POA: Insufficient documentation

## 2013-02-02 DIAGNOSIS — Z87891 Personal history of nicotine dependence: Secondary | ICD-10-CM | POA: Insufficient documentation

## 2013-02-02 DIAGNOSIS — Z853 Personal history of malignant neoplasm of breast: Secondary | ICD-10-CM | POA: Insufficient documentation

## 2013-02-02 DIAGNOSIS — Y9389 Activity, other specified: Secondary | ICD-10-CM | POA: Insufficient documentation

## 2013-02-02 DIAGNOSIS — F411 Generalized anxiety disorder: Secondary | ICD-10-CM | POA: Insufficient documentation

## 2013-02-02 DIAGNOSIS — Z7982 Long term (current) use of aspirin: Secondary | ICD-10-CM | POA: Insufficient documentation

## 2013-02-02 LAB — CBC
HCT: 31 % — ABNORMAL LOW (ref 36.0–46.0)
Hemoglobin: 10 g/dL — ABNORMAL LOW (ref 12.0–15.0)
MCH: 29.5 pg (ref 26.0–34.0)
MCHC: 32.3 g/dL (ref 30.0–36.0)
MCV: 91.4 fL (ref 78.0–100.0)

## 2013-02-02 LAB — BASIC METABOLIC PANEL
Calcium: 9.2 mg/dL (ref 8.4–10.5)
GFR calc non Af Amer: 64 mL/min — ABNORMAL LOW (ref >90)
Sodium: 137 mEq/L (ref 135–145)

## 2013-02-02 LAB — URINALYSIS, ROUTINE W REFLEX MICROSCOPIC
Ketones, ur: NEGATIVE mg/dL
Leukocytes, UA: NEGATIVE
Protein, ur: NEGATIVE mg/dL
Urobilinogen, UA: 0.2 mg/dL (ref 0.0–1.0)

## 2013-02-02 NOTE — ED Notes (Signed)
WUJ:[id number]<JY> Expected date:<BR> Expected time:<BR> Means of arrival:Ambulance<BR> Comments:<BR> 85yoF,fall

## 2013-02-02 NOTE — ED Notes (Signed)
Pt. Walked well in the hall she walked from her bed to the nurses station.

## 2013-02-02 NOTE — ED Provider Notes (Signed)
History    CSN: [id number] Arrival date & time 02/02/13  1545  First MD Initiated Contact with Patient 02/02/13 1620     Chief Complaint  Patient presents with  . Fall   (Consider location/radiation/quality/duration/timing/severity/associated sxs/prior Treatment) HPI Comments: 79 year old female with a past medical history of Alzheimer's, hypertension, hypothyroidism, anxiety, syncope, sinus bradycardia and dehydration brought in to the emergency department via EMS from nursing home [facility] after falling around 1:30 this afternoon. The fall was unwitnessed, nursing home called her son who is now in the emergency department with her. Triage summary states she tripped in the grass and fell, however her son states nursing home told him that they found her outside on the ground. Patient qualifies as a level V caveat due to dementia.  Patient is a 79 y.o. female presenting with fall. The history is provided by the nursing home and a relative.  Fall   Past Medical History  Diagnosis Date  . Hypertension   . Alzheimer disease   . Depression   . Breast cancer   . Sleep apnea   . Hypothyroidism   . Anxiety   . Syncope and collapse   . Sinus bradycardia   . UTI (urinary tract infection)     ACUTE  . Dehydration     ACUTE  . Bradycardia    Past Surgical History  Procedure Laterality Date  . Cholecystectomy    . Breast surgery      bilteral mastectomy's   Family History  Problem Relation Age of Onset  . Heart failure Mother   . Parkinsonism Father    History  Substance Use Topics  . Smoking status: Former Smoker    Quit date: 12/21/1987  . Smokeless tobacco: Never Used  . Alcohol Use: No   OB History   Grav Para Term Preterm Abortions TAB SAB Ect Mult Living                 Review of Systems  Unable to perform ROS: Dementia    Allergies  Ace inhibitors; Bystolic; and Ciprofloxacin  Home Medications   Current Outpatient Rx  Name  Route  Sig  Dispense   Refill  . aspirin EC 81 MG tablet   Oral   Take 81 mg by mouth daily.         . Calcium Carbonate-Vitamin D (CALCIUM + D PO)   Oral   Take 600 mg by mouth 2 (two) times daily.           . Dermatological Products, Misc. (NUVAIL) SOLN   Topical   Apply 1 application topically at bedtime. Applied to nails on both feet.         . feeding supplement (ENSURE IMMUNE HEALTH) LIQD   Oral   Take 237 mLs by mouth daily with supper.         Marland Kitchen ibuprofen (ADVIL,MOTRIN) 600 MG tablet   Oral   Take 600 mg by mouth every 8 (eight) hours as needed for pain.         Marland Kitchen levothyroxine (SYNTHROID, LEVOTHROID) 100 MCG tablet   Oral   Take 100 mcg by mouth daily.          Marland Kitchen LORazepam (ATIVAN) 0.5 MG tablet   Oral   Take 0.5 mg by mouth every 6 (six) hours as needed for anxiety (or agitation).          Marland Kitchen losartan (COZAAR) 25 MG tablet   Oral   Take 25  mg by mouth 2 (two) times daily.           . polyethylene glycol (MIRALAX / GLYCOLAX) packet   Oral   Take 17 g by mouth daily as needed (for constipation).         . rivastigmine (EXELON) 9.5 mg/24hr   Transdermal   Place 1 patch onto the skin daily.         Marland Kitchen senna-docusate (SENOKOT-S) 8.6-50 MG per tablet   Oral   Take 2 tablets by mouth daily.         . ursodiol (ACTIGALL) 300 MG capsule   Oral   Take 300 mg by mouth 3 (three) times daily.          Marland Kitchen venlafaxine (EFFEXOR) 37.5 MG tablet   Oral   Take 37.5 mg by mouth 2 (two) times daily.          . vitamin B-12 (CYANOCOBALAMIN) 1000 MCG tablet   Oral   Take 1,000 mcg by mouth 2 (two) times daily.           . ziprasidone (GEODON) 60 MG capsule   Oral   Take 60 mg by mouth 2 (two) times daily with a meal.            BP 156/75  Pulse 72  Temp(Src) 97.5 F (36.4 C) (Oral)  Resp 16  SpO2 100% Physical Exam  Nursing note and vitals reviewed. Constitutional: She appears well-developed and well-nourished. No distress.  HENT:  Head: Normocephalic  and atraumatic.  Mouth/Throat: Oropharynx is clear and moist.  Eyes: Conjunctivae are normal. Pupils are equal, round, and reactive to light.  Neck: Normal range of motion. Neck supple. No spinous process tenderness and no muscular tenderness present.  Cardiovascular: Normal rate, regular rhythm, normal heart sounds and intact distal pulses.   Pulses:      Radial pulses are 2+ on the right side, and 2+ on the left side.       Dorsalis pedis pulses are 2+ on the right side, and 2+ on the left side.       Posterior tibial pulses are 2+ on the right side, and 2+ on the left side.  Pulmonary/Chest: Effort normal and breath sounds normal. No respiratory distress. She has no wheezes. She has no rales.  Abdominal: Soft. Bowel sounds are normal. She exhibits no distension.  Musculoskeletal:  Uncooperative on examination of left elbow and wrist, cringes face when trying to move elbow and wrist. Overlying abrasion on left elbow, bleeding controlled. TTP of lateral aspect of proximal femur, possible internal rotation and shortening, no obvious deformity, may be positional. Good ROM of right hip without any facial cringing.  Neurological: She is alert.  Dementia, baseline per son.  Skin: Skin is warm and dry. She is not diaphoretic.  Psychiatric: Her behavior is normal.    ED Course  Procedures (including critical care time) Labs Reviewed  CBC  BASIC METABOLIC PANEL  URINALYSIS, ROUTINE W REFLEX MICROSCOPIC   Date: 02/02/2013  Rate: 72  Rhythm: normal sinus rhythm  QRS Axis: normal  Intervals: normal  ST/T Wave abnormalities: normal  Conduction Disutrbances:right bundle branch block  Narrative Interpretation: no stemi  Old EKG Reviewed: unchanged   Dg Elbow Complete Left  02/02/2013   *RADIOLOGY REPORT*  Clinical Data: Larey Seat today with pain  LEFT ELBOW - COMPLETE 3+ VIEW  Comparison: None.  Findings: The bones are osteopenic.  No acute fracture is seen.  No joint effusion is noted.  IMPRESSION: Osteopenia.  No acute abnormality.   Original Report Authenticated By: Dwyane Dee, M.D.   Dg Wrist Complete Left  02/02/2013   *RADIOLOGY REPORT*  Clinical Data: Larey Seat today with pain  LEFT WRIST - COMPLETE 3+ VIEW  Comparison: None.  Findings: The bones are diffusely osteopenic.  There is some narrowing of the radiocarpal joint space.  The carpal bones appear to be in normal position with no acute fracture.  There is degenerative change involving the articulation of the bases of the first and second metacarpals with the carpals with loss of joint space and sclerosis with spurring.  IMPRESSION: Degenerative change at the articulation of the bases of the first and second metacarpals with the carpals.  No acute fracture. Osteopenia.   Original Report Authenticated By: Dwyane Dee, M.D.   Dg Hip Complete Right  02/02/2013   *RADIOLOGY REPORT*  Clinical Data: 79 year old female status post fall with pain.  RIGHT HIP - COMPLETE 2+ VIEW  Comparison: None.  Findings: Bone mineralization is within normal limits for age. Femoral heads normally located.  Joint spaces are preserved. Pelvis appears intact. Visualized bowel gas pattern is nonobstructed.  Proximal right femur intact.  IMPRESSION: No acute fracture or dislocation identified about the right hip or pelvis.   Original Report Authenticated By: Erskine Speed, M.D.   Ct Head Wo Contrast  02/02/2013   *RADIOLOGY REPORT*  Clinical Data: Fall  CT HEAD WITHOUT CONTRAST  Technique:  Contiguous axial images were obtained from the base of the skull through the vertex without contrast.  Comparison: 11/07/2011  Findings: Moderately severe atrophy with enlargement of the ventricles and subarachnoid space.  Chronic infarct left cerebellum.  No acute infarct.  Negative for hemorrhage or mass. No edema or midline shift.  Negative for skull fracture.  IMPRESSION: Moderately severe atrophy.  No acute abnormality.   Original Report Authenticated By: Janeece Riggers, M.D.    1. Fall, initial encounter     MDM  Unwitnessed fall, dementia, unable to give history. Uncooperative on exam of left elbow and wrist. Tenderness to right hip, possible shortening of leg, could be positional. Good right hip ROM. At baseline mentally per son.  Obtaining basic labs- cbc, bmp; imaging- CT head to r/o bleed, left elbow/wrist xray, right hip xray; EKG. Patient discussed with Dr. Rhunette Croft who agrees with plan of care. 7:41 PM Imaging studies without any acute abnormalities. Labs unconcerning. EKG without changes. She is in NAD, baseline mentation. She was able to ambulate down the hall of the ED without difficulty. Stable for discharge back to nursing home. Dr. Rhunette Croft agreeable.  Trevor Mace, PA-C 02/02/13 1942  Trevor Mace, PA-C 02/02/13 1942

## 2013-02-02 NOTE — Progress Notes (Signed)
CSW confirmed patient is resident at [facility]. Patient currently out of room for test/procedure.   Catha Gosselin, LCSWA  [phone] .02/02/2013 1725pm

## 2013-02-02 NOTE — ED Notes (Signed)
DNR sticker placed on arm band; DNR order sent from nursing home.  Pt's son, Onalee Hua, is now at bedside.

## 2013-02-02 NOTE — ED Notes (Signed)
Per EMS: Pt from [facility].  Pt does not use walker and tripped in grass around 1330; sustained left elbow skin tear.

## 2013-02-02 NOTE — ED Notes (Signed)
Unable to obtain urine specimen, during in and out cath there was no urine return. Nurse Notified.

## 2013-02-02 NOTE — Progress Notes (Signed)
Pt confirms pcp is Eliezer Bottom  EPIC updated

## 2013-02-03 NOTE — ED Provider Notes (Signed)
Medical screening examination/treatment/procedure(s) were conducted as a shared visit with non-physician practitioner(s) and myself.  I personally evaluated the patient during the encounter  Fall, appropriate imaging ordered. Pt has some elbow pain, no other focal findings appreciated on exam.  Derwood Kaplan, MD 02/03/13 0110

## 2013-03-13 ENCOUNTER — Other Ambulatory Visit: Payer: Self-pay | Admitting: Geriatric Medicine

## 2013-03-13 MED ORDER — LORAZEPAM 0.5 MG PO TABS: 0.5000 mg | ORAL_TABLET | Freq: Four times a day (QID) | ORAL | Status: DC | PRN

## 2013-03-23 ENCOUNTER — Emergency Department (HOSPITAL_COMMUNITY)
Admission: EM | Admit: 2013-03-23 | Discharge: 2013-03-23 | Disposition: A | Discharge status: Home / Self Care | Payer: Medicare Other | Attending: Emergency Medicine | Admitting: Emergency Medicine

## 2013-03-23 ENCOUNTER — Encounter (HOSPITAL_COMMUNITY): Payer: Self-pay | Admitting: Emergency Medicine

## 2013-03-23 DIAGNOSIS — F0280 Dementia in other diseases classified elsewhere without behavioral disturbance: Secondary | ICD-10-CM | POA: Insufficient documentation

## 2013-03-23 DIAGNOSIS — I10 Essential (primary) hypertension: Secondary | ICD-10-CM | POA: Insufficient documentation

## 2013-03-23 DIAGNOSIS — F411 Generalized anxiety disorder: Secondary | ICD-10-CM | POA: Insufficient documentation

## 2013-03-23 DIAGNOSIS — Z862 Personal history of diseases of the blood and blood-forming organs and certain disorders involving the immune mechanism: Secondary | ICD-10-CM | POA: Insufficient documentation

## 2013-03-23 DIAGNOSIS — Y929 Unspecified place or not applicable: Secondary | ICD-10-CM | POA: Insufficient documentation

## 2013-03-23 DIAGNOSIS — Z87440 Personal history of urinary (tract) infections: Secondary | ICD-10-CM | POA: Insufficient documentation

## 2013-03-23 DIAGNOSIS — Z853 Personal history of malignant neoplasm of breast: Secondary | ICD-10-CM | POA: Insufficient documentation

## 2013-03-23 DIAGNOSIS — Z87891 Personal history of nicotine dependence: Secondary | ICD-10-CM | POA: Insufficient documentation

## 2013-03-23 DIAGNOSIS — Y9389 Activity, other specified: Secondary | ICD-10-CM | POA: Insufficient documentation

## 2013-03-23 DIAGNOSIS — W010XXA Fall on same level from slipping, tripping and stumbling without subsequent striking against object, initial encounter: Secondary | ICD-10-CM | POA: Insufficient documentation

## 2013-03-23 DIAGNOSIS — Z79899 Other long term (current) drug therapy: Secondary | ICD-10-CM | POA: Insufficient documentation

## 2013-03-23 DIAGNOSIS — Z043 Encounter for examination and observation following other accident: Secondary | ICD-10-CM | POA: Insufficient documentation

## 2013-03-23 DIAGNOSIS — F329 Major depressive disorder, single episode, unspecified: Secondary | ICD-10-CM | POA: Insufficient documentation

## 2013-03-23 DIAGNOSIS — Z8639 Personal history of other endocrine, nutritional and metabolic disease: Secondary | ICD-10-CM | POA: Insufficient documentation

## 2013-03-23 DIAGNOSIS — E039 Hypothyroidism, unspecified: Secondary | ICD-10-CM | POA: Insufficient documentation

## 2013-03-23 DIAGNOSIS — Z8679 Personal history of other diseases of the circulatory system: Secondary | ICD-10-CM | POA: Insufficient documentation

## 2013-03-23 DIAGNOSIS — G309 Alzheimer's disease, unspecified: Secondary | ICD-10-CM | POA: Insufficient documentation

## 2013-03-23 DIAGNOSIS — F3289 Other specified depressive episodes: Secondary | ICD-10-CM | POA: Insufficient documentation

## 2013-03-23 DIAGNOSIS — Z7982 Long term (current) use of aspirin: Secondary | ICD-10-CM | POA: Insufficient documentation

## 2013-03-23 NOTE — ED Notes (Signed)
[id number]> Expected date:<BR> Expected time:<BR> Means of arrival:<BR> Comments:<BR> ems 79 yo fall

## 2013-03-23 NOTE — ED Provider Notes (Signed)
CSN: [id number]     Arrival date & time 03/23/13  1732 History     First MD Initiated Contact with Patient 03/23/13 1753     Chief Complaint  Patient presents with  . Fall   (Consider location/radiation/quality/duration/timing/severity/associated sxs/prior Treatment) Patient is a 79 y.o. female presenting with fall. The history is provided by the patient.  Fall Pertinent negatives include no chest pain, no headaches and no shortness of breath.  pt with hx advanced dementia, from ecf, s/p fall earlier today. Pt states is supposed to walk w walker. w fall , denies loc. Pt reported to have remained alert, and at baseline since fall. No nv. Pt denies any pain. No headache. No neck or back pain. No chest pain or trouble breathing. No abd pain. Denies extremity pain or injury, skin intact. Level 5 caveat, dementia.     Past Medical History  Diagnosis Date  . Hypertension   . Alzheimer disease   . Depression   . Breast cancer   . Sleep apnea   . Hypothyroidism   . Anxiety   . Syncope and collapse   . Sinus bradycardia   . UTI (urinary tract infection)     ACUTE  . Dehydration     ACUTE  . Bradycardia    Past Surgical History  Procedure Laterality Date  . Cholecystectomy    . Breast surgery      bilteral mastectomy's   Family History  Problem Relation Age of Onset  . Heart failure Mother   . Parkinsonism Father    History  Substance Use Topics  . Smoking status: Former Smoker    Quit date: 12/21/1987  . Smokeless tobacco: Never Used  . Alcohol Use: No   OB History   Grav Para Term Preterm Abortions TAB SAB Ect Mult Living                 Review of Systems  Unable to perform ROS: Dementia  Constitutional: Negative for fever.  HENT: Negative for neck pain.   Respiratory: Negative for shortness of breath.   Cardiovascular: Negative for chest pain.  Musculoskeletal: Negative for back pain.  Neurological: Negative for headaches.  level 5 caveat,  dementia   Allergies  Ace inhibitors; Bystolic; and Ciprofloxacin  Home Medications   Current Outpatient Rx  Name  Route  Sig  Dispense  Refill  . aspirin EC 81 MG tablet   Oral   Take 81 mg by mouth daily.         . Calcium Carbonate-Vitamin D (CALCIUM + D PO)   Oral   Take 600 mg by mouth 2 (two) times daily.           . Dermatological Products, Misc. (NUVAIL) SOLN   Topical   Apply 1 application topically at bedtime. Applied to nails on both feet.         . divalproex (DEPAKOTE SPRINKLE) 125 MG capsule   Oral   Take 125 mg by mouth 3 (three) times daily.         . feeding supplement (ENSURE IMMUNE HEALTH) LIQD   Oral   Take 237 mLs by mouth daily with supper.         . levothyroxine (SYNTHROID, LEVOTHROID) 100 MCG tablet   Oral   Take 100 mcg by mouth daily.          Marland Kitchen LORazepam (ATIVAN) 0.5 MG tablet   Oral   Take 1 tablet (0.5 mg total) by mouth every  6 (six) hours as needed for anxiety (or agitation).   120 tablet   5   . losartan (COZAAR) 25 MG tablet   Oral   Take 25 mg by mouth 2 (two) times daily.           . rivastigmine (EXELON) 9.5 mg/24hr   Transdermal   Place 1 patch onto the skin daily.         Marland Kitchen senna-docusate (SENOKOT-S) 8.6-50 MG per tablet   Oral   Take 2 tablets by mouth daily.         . ursodiol (ACTIGALL) 300 MG capsule   Oral   Take 300 mg by mouth 3 (three) times daily.          Marland Kitchen venlafaxine XR (EFFEXOR-XR) 75 MG 24 hr capsule   Oral   Take 75 mg by mouth every morning.         . vitamin B-12 (CYANOCOBALAMIN) 1000 MCG tablet   Oral   Take 1,000 mcg by mouth 2 (two) times daily.           Marland Kitchen ibuprofen (ADVIL,MOTRIN) 600 MG tablet   Oral   Take 600 mg by mouth every 8 (eight) hours as needed for pain.         . polyethylene glycol (MIRALAX / GLYCOLAX) packet   Oral   Take 17 g by mouth daily as needed (for constipation).         . ziprasidone (GEODON) 60 MG capsule   Oral   Take 60 mg by mouth  2 (two) times daily with a meal.            BP 176/77  Pulse 66  Temp(Src) 97.9 F (36.6 C) (Oral)  Resp 18  SpO2 100% Physical Exam  Nursing note and vitals reviewed. Constitutional: She appears well-developed and well-nourished. No distress.  HENT:  Head: Atraumatic.  Mouth/Throat: Oropharynx is clear and moist.  No scalp or facial sts, pain or tenderness.  Eyes: Conjunctivae are normal. Pupils are equal, round, and reactive to light. No scleral icterus.  Neck: Normal range of motion. Neck supple. No tracheal deviation present.  Cardiovascular: Normal rate, regular rhythm, normal heart sounds and intact distal pulses.   Pulmonary/Chest: Effort normal and breath sounds normal. No respiratory distress.  Abdominal: Soft. Normal appearance and bowel sounds are normal. She exhibits no distension. There is no tenderness.  Musculoskeletal: Normal range of motion. She exhibits no edema and no tenderness.  Good rom bil extremities without pain or bony tenderness. No sts. Distal pulses palp bil.   CTLS spine, non tender, aligned, no step off.   Neurological: She is alert.  Alert, content. Answers w one word answers.  Moves bil extremities purposefully.    Skin: Skin is warm and dry. No rash noted.  Psychiatric: She has a normal mood and affect.    ED Course   Procedures (including critical care time)   MDM  Pt denies pain. No visible trauma/injury. No head/scalp contusion or tenderness, spine nt, good rom bil ext without pain or focal bony tenderness.   Reviewed nursing notes and prior charts for additional history.   Recheck, pt amb w assist. Comfortable. Alert. No pain or discomfort. Appears stable for d/c.   Suzi Roots, MD 03/24/13 2142

## 2013-03-23 NOTE — ED Notes (Signed)
Called to check on status of [facility], reports next dispatch is for this pt.

## 2013-03-23 NOTE — ED Notes (Signed)
PER EMS- pt picked up from [facility] with c/o witnessed fall today.  Reports that pt was walking along wall and then she fell back.  Reports pt's head hit ground.  Pt alert and oriented per baseline. No loc or nausea.

## 2013-03-25 ENCOUNTER — Emergency Department (HOSPITAL_COMMUNITY): Payer: Medicare Other

## 2013-03-25 ENCOUNTER — Observation Stay (HOSPITAL_COMMUNITY)
Admission: EM | Admit: 2013-03-25 | Discharge: 2013-03-26 | Payer: Medicare Other | Attending: Internal Medicine | Admitting: Internal Medicine

## 2013-03-25 ENCOUNTER — Encounter (HOSPITAL_COMMUNITY): Payer: Self-pay | Admitting: Emergency Medicine

## 2013-03-25 DIAGNOSIS — C50919 Malignant neoplasm of unspecified site of unspecified female breast: Secondary | ICD-10-CM

## 2013-03-25 DIAGNOSIS — F0280 Dementia in other diseases classified elsewhere without behavioral disturbance: Secondary | ICD-10-CM | POA: Diagnosis present

## 2013-03-25 DIAGNOSIS — G4730 Sleep apnea, unspecified: Secondary | ICD-10-CM

## 2013-03-25 DIAGNOSIS — R55 Syncope and collapse: Principal | ICD-10-CM | POA: Insufficient documentation

## 2013-03-25 DIAGNOSIS — E860 Dehydration: Secondary | ICD-10-CM | POA: Insufficient documentation

## 2013-03-25 DIAGNOSIS — M4310 Spondylolisthesis, site unspecified: Secondary | ICD-10-CM | POA: Insufficient documentation

## 2013-03-25 DIAGNOSIS — R4182 Altered mental status, unspecified: Secondary | ICD-10-CM

## 2013-03-25 DIAGNOSIS — R296 Repeated falls: Secondary | ICD-10-CM

## 2013-03-25 DIAGNOSIS — I10 Essential (primary) hypertension: Secondary | ICD-10-CM | POA: Diagnosis present

## 2013-03-25 DIAGNOSIS — R001 Bradycardia, unspecified: Secondary | ICD-10-CM

## 2013-03-25 DIAGNOSIS — Z9181 History of falling: Secondary | ICD-10-CM | POA: Insufficient documentation

## 2013-03-25 DIAGNOSIS — G4733 Obstructive sleep apnea (adult) (pediatric): Secondary | ICD-10-CM

## 2013-03-25 DIAGNOSIS — I498 Other specified cardiac arrhythmias: Secondary | ICD-10-CM | POA: Insufficient documentation

## 2013-03-25 DIAGNOSIS — E039 Hypothyroidism, unspecified: Secondary | ICD-10-CM | POA: Diagnosis present

## 2013-03-25 DIAGNOSIS — F329 Major depressive disorder, single episode, unspecified: Secondary | ICD-10-CM

## 2013-03-25 DIAGNOSIS — N390 Urinary tract infection, site not specified: Secondary | ICD-10-CM | POA: Insufficient documentation

## 2013-03-25 DIAGNOSIS — M47812 Spondylosis without myelopathy or radiculopathy, cervical region: Secondary | ICD-10-CM | POA: Insufficient documentation

## 2013-03-25 DIAGNOSIS — G309 Alzheimer's disease, unspecified: Secondary | ICD-10-CM | POA: Insufficient documentation

## 2013-03-25 DIAGNOSIS — F419 Anxiety disorder, unspecified: Secondary | ICD-10-CM

## 2013-03-25 DIAGNOSIS — I951 Orthostatic hypotension: Secondary | ICD-10-CM | POA: Diagnosis present

## 2013-03-25 LAB — CBC
MCHC: 33 g/dL (ref 30.0–36.0)
MCV: 92.1 fL (ref 78.0–100.0)
Platelets: 138 10*3/uL — ABNORMAL LOW (ref 150–400)
RDW: 13.9 % (ref 11.5–15.5)
WBC: 5.7 10*3/uL (ref 4.0–10.5)

## 2013-03-25 LAB — URINALYSIS, ROUTINE W REFLEX MICROSCOPIC
Bilirubin Urine: NEGATIVE
Glucose, UA: NEGATIVE mg/dL
Hgb urine dipstick: NEGATIVE
Ketones, ur: NEGATIVE mg/dL
Protein, ur: NEGATIVE mg/dL
Urobilinogen, UA: 1 mg/dL (ref 0.0–1.0)

## 2013-03-25 LAB — BASIC METABOLIC PANEL
Chloride: 99 mEq/L (ref 96–112)
Creatinine, Ser: 0.99 mg/dL (ref 0.50–1.10)
GFR calc Af Amer: 59 mL/min — ABNORMAL LOW (ref >90)
GFR calc non Af Amer: 51 mL/min — ABNORMAL LOW (ref >90)
Potassium: 4.2 mEq/L (ref 3.5–5.1)

## 2013-03-25 LAB — VALPROIC ACID LEVEL: Valproic Acid Lvl: 34.8 ug/mL — ABNORMAL LOW (ref 50.0–100.0)

## 2013-03-25 LAB — AMMONIA: Ammonia: 10 umol/L — ABNORMAL LOW (ref 11–60)

## 2013-03-25 LAB — LACTIC ACID, PLASMA: Lactic Acid, Venous: 0.8 mmol/L (ref 0.5–2.2)

## 2013-03-25 NOTE — ED Notes (Signed)
[facility] post fall. Patients fall was not witness but heard. EMS states they found patient with her head up against the wall and patient was complaining of her left head and neck pain. She has a aspen collar in place. Patient has alzheimer's and staff states her lethergy now is not her normal state. Son is at bedside and states she has had her alzheimer's medication adjusted several weeks ago and has been unsteady on her feet ever since. She was just seen at [facility] recently for a fall.

## 2013-03-25 NOTE — ED Provider Notes (Signed)
CSN: [id number]     Arrival date & time 03/25/13  2051 History     First MD Initiated Contact with Patient 03/25/13 2052     Chief Complaint  Patient presents with  . Fall   (Consider location/radiation/quality/duration/timing/severity/associated sxs/prior Treatment) Patient is a 79 y.o. female presenting with fall. The history is provided by the patient and the EMS personnel.  Fall This is a new problem. The current episode started today. The problem occurs every several days. The problem has been waxing and waning. Nothing aggravates the symptoms. She has tried nothing for the symptoms. The treatment provided no relief.    Past Medical History  Diagnosis Date  . Hypertension   . Alzheimer disease   . Depression   . Breast cancer   . Sleep apnea   . Hypothyroidism   . Anxiety   . Syncope and collapse   . Sinus bradycardia   . UTI (urinary tract infection)     ACUTE  . Dehydration     ACUTE  . Bradycardia    Past Surgical History  Procedure Laterality Date  . Cholecystectomy    . Breast surgery      bilteral mastectomy's   Family History  Problem Relation Age of Onset  . Heart failure Mother   . Parkinsonism Father    History  Substance Use Topics  . Smoking status: Former Smoker    Quit date: 12/21/1987  . Smokeless tobacco: Never Used  . Alcohol Use: No   OB History   Grav Para Term Preterm Abortions TAB SAB Ect Mult Living                 Review of Systems  Unable to perform ROS: Dementia    Allergies  Ace inhibitors; Bystolic; and Ciprofloxacin  Home Medications   Current Outpatient Rx  Name  Route  Sig  Dispense  Refill  . aspirin EC 81 MG tablet   Oral   Take 81 mg by mouth daily.         . Calcium Carbonate-Vitamin D (CALCIUM + D PO)   Oral   Take 600 mg by mouth 2 (two) times daily.           . Dermatological Products, Misc. (NUVAIL) SOLN   Topical   Apply 1 application topically at bedtime. Applied to nails on both feet.          . divalproex (DEPAKOTE SPRINKLE) 125 MG capsule   Oral   Take 125 mg by mouth 3 (three) times daily.         . feeding supplement (ENSURE IMMUNE HEALTH) LIQD   Oral   Take 237 mLs by mouth daily with supper.         Marland Kitchen ibuprofen (ADVIL,MOTRIN) 600 MG tablet   Oral   Take 600 mg by mouth every 8 (eight) hours as needed for pain.         Marland Kitchen levothyroxine (SYNTHROID, LEVOTHROID) 100 MCG tablet   Oral   Take 100 mcg by mouth daily.          Marland Kitchen LORazepam (ATIVAN) 0.5 MG tablet   Oral   Take 1 tablet (0.5 mg total) by mouth every 6 (six) hours as needed for anxiety (or agitation).   120 tablet   5   . losartan (COZAAR) 25 MG tablet   Oral   Take 25 mg by mouth 2 (two) times daily.           Marland Kitchen  polyethylene glycol (MIRALAX / GLYCOLAX) packet   Oral   Take 17 g by mouth daily as needed (for constipation).         . rivastigmine (EXELON) 9.5 mg/24hr   Transdermal   Place 1 patch onto the skin daily.         Marland Kitchen senna-docusate (SENOKOT-S) 8.6-50 MG per tablet   Oral   Take 2 tablets by mouth daily.         . ursodiol (ACTIGALL) 300 MG capsule   Oral   Take 300 mg by mouth 3 (three) times daily.          Marland Kitchen venlafaxine XR (EFFEXOR-XR) 75 MG 24 hr capsule   Oral   Take 75 mg by mouth every morning.         . vitamin B-12 (CYANOCOBALAMIN) 1000 MCG tablet   Oral   Take 1,000 mcg by mouth 2 (two) times daily.            BP 145/76  Pulse 63  Temp(Src) 97.2 F (36.2 C) (Axillary)  Resp 20  SpO2 99% Physical Exam  Nursing note and vitals reviewed. Constitutional: She appears well-developed and well-nourished. No distress.  HENT:  Head: Normocephalic and atraumatic.  Right Ear: External ear normal.  Left Ear: External ear normal.  Nose: Nose normal.  Mouth/Throat: Oropharynx is clear and moist. No oropharyngeal exudate.  Eyes: Conjunctivae and EOM are normal. Pupils are equal, round, and reactive to light. Right eye exhibits no discharge. Left eye  exhibits no discharge.  Neck: Normal range of motion. Neck supple. No JVD present. No tracheal deviation present. No thyromegaly present.  Cardiovascular: Normal rate, regular rhythm, normal heart sounds and intact distal pulses.  Exam reveals no gallop and no friction rub.   No murmur heard. Pulmonary/Chest: Effort normal and breath sounds normal. No respiratory distress. She has no wheezes. She has no rales. She exhibits no tenderness.  Abdominal: Soft. Bowel sounds are normal. She exhibits no distension. There is no tenderness. There is no rebound and no guarding.  Musculoskeletal: Normal range of motion.  Lymphadenopathy:    She has no cervical adenopathy.  Neurological: She is alert. No cranial nerve deficit or sensory deficit. GCS eye subscore is 4. GCS verbal subscore is 4. GCS motor subscore is 6.  Patient alert to self only  Skin: Skin is warm. No rash noted. She is not diaphoretic.    ED Course   Procedures (including critical care time)  Labs Reviewed  CBC - Abnormal; Notable for the following:    RBC 3.65 (*)    Hemoglobin 11.1 (*)    HCT 33.6 (*)    Platelets 138 (*)    All other components within normal limits  BASIC METABOLIC PANEL - Abnormal; Notable for the following:    CO2 34 (*)    BUN 29 (*)    GFR calc non Af Amer 51 (*)    GFR calc Af Amer 59 (*)    All other components within normal limits  VALPROIC ACID LEVEL - Abnormal; Notable for the following:    Valproic Acid Lvl 34.8 (*)    All other components within normal limits  AMMONIA - Abnormal; Notable for the following:    Ammonia 10 (*)    All other components within normal limits  URINE CULTURE  LACTIC ACID, PLASMA  TROPONIN I  URINALYSIS, ROUTINE W REFLEX MICROSCOPIC   Ct Head Wo Contrast  03/25/2013   *RADIOLOGY REPORT*  Clinical Data:  Status post fall with head injury.  Altered mental status.  CT HEAD WITHOUT CONTRAST CT CERVICAL SPINE WITHOUT CONTRAST  Technique:  Multidetector CT imaging of  the head and cervical spine was performed following the standard protocol without intravenous contrast.  Multiplanar CT image reconstructions of the cervical spine were also generated.  Comparison:  Head CT 02/02/2013.  CT HEAD  Findings: The patient's head is tilted within the CT gantry. Chronic atrophy and a chronic left cerebellar infarct are stable. There is no evidence of acute intracranial hemorrhage, mass lesion, brain edema or extra-axial fluid collection.  There is no CT evidence of acute stroke.  The visualized paranasal sinuses, mastoid air cells and middle ears are clear.  The calvarium is intact.  IMPRESSION: Stable head CT.  No acute intracranial or calvarial findings.  CT CERVICAL SPINE  Findings: There is no evidence of acute cervical spine fracture or traumatic subluxation.  There is straightening of the usual cervical lordosis with a grade 1 degenerative anterolisthesis at C3- C4 and C4-C5.  The facet joints appear fused on the right at C3-C5 and on the left at C3-C4.  There is multilevel disc space loss with posterior osteophytes, greatest at C5-C6 and C6-C7.  No acute soft tissue abnormalities are identified.  Carotid artery calcifications and mild biapical pulmonary scarring are noted.  IMPRESSION:  1.  No evidence of acute cervical spine fracture, traumatic subluxation or static signs instability. 2.  Moderate multilevel spondylosis as described.   Original Report Authenticated By: Carey Bullocks, M.D.   Ct Cervical Spine Wo Contrast  03/25/2013   *RADIOLOGY REPORT*  Clinical Data:  Status post fall with head injury.  Altered mental status.  CT HEAD WITHOUT CONTRAST CT CERVICAL SPINE WITHOUT CONTRAST  Technique:  Multidetector CT imaging of the head and cervical spine was performed following the standard protocol without intravenous contrast.  Multiplanar CT image reconstructions of the cervical spine were also generated.  Comparison:  Head CT 02/02/2013.  CT HEAD  Findings: The patient's  head is tilted within the CT gantry. Chronic atrophy and a chronic left cerebellar infarct are stable. There is no evidence of acute intracranial hemorrhage, mass lesion, brain edema or extra-axial fluid collection.  There is no CT evidence of acute stroke.  The visualized paranasal sinuses, mastoid air cells and middle ears are clear.  The calvarium is intact.  IMPRESSION: Stable head CT.  No acute intracranial or calvarial findings.  CT CERVICAL SPINE  Findings: There is no evidence of acute cervical spine fracture or traumatic subluxation.  There is straightening of the usual cervical lordosis with a grade 1 degenerative anterolisthesis at C3- C4 and C4-C5.  The facet joints appear fused on the right at C3-C5 and on the left at C3-C4.  There is multilevel disc space loss with posterior osteophytes, greatest at C5-C6 and C6-C7.  No acute soft tissue abnormalities are identified.  Carotid artery calcifications and mild biapical pulmonary scarring are noted.  IMPRESSION:  1.  No evidence of acute cervical spine fracture, traumatic subluxation or static signs instability. 2.  Moderate multilevel spondylosis as described.   Original Report Authenticated By: Carey Bullocks, M.D.   Dg Chest Portable 1 View  03/25/2013   *RADIOLOGY REPORT*  Clinical Data: Trauma from a fall.  PORTABLE CHEST - 1 VIEW  Comparison: Chest x-ray 08/16/2011.  Findings: Focal area of scarring in the left mid lung is unchanged. Mild diffuse peribronchial cuffing appears to be chronic. Lung volumes are normal.  No consolidative  airspace disease.  No pleural effusions.  No pneumothorax.  No pulmonary nodule or mass noted. Pulmonary vasculature and the cardiomediastinal silhouette are within normal limits.  Atherosclerosis of the thoracic aorta. Surgical clips overlying the left breast.  IMPRESSION: 1. No radiographic evidence of acute cardiopulmonary disease. 2.  Atherosclerosis. 3.  Chronic scarring in the left midlung is unchanged. 4.  Mild  diffuse peribronchial cuffing, similar to prior examinations, presumably related to chronic bronchitis.   Original Report Authenticated By: Trudie Reed, M.D.   1. Syncope   2. Altered mental status     MDM  79 yr old F pt with fall. Patient evaluated for fall recently. EMS says patient fell today, it was unwitnessed and just heard. Likely hit head on wall. Normal exam otherwise. Son at bedside says patient is at baseline. Patient does not communicate much and is not able to provide detailed information thus is not able to perform a good ROS. Exam seems unchanged from previous documented and patient does not have any serious headache or neck pain. Will Ct to screen for injury and will get labs to look for reasons why she is having falls. Son reports that patient has been having increasing frequency in falls related to what he believes is her medicines.   Patient will be admitted to the hospital for her continued falls.   Date: 03/25/2013  Rate: 68  Rhythm: normal sinus rhythm  QRS Axis: left  Intervals: normal  ST/T Wave abnormalities: nonspecific T wave changes  Conduction Disutrbances:right bundle branch block  Narrative Interpretation:   Old EKG Reviewed: unchanged    Case discussed with Dr. Macario Carls, MD 03/25/13 [phone]

## 2013-03-26 DIAGNOSIS — E039 Hypothyroidism, unspecified: Secondary | ICD-10-CM

## 2013-03-26 DIAGNOSIS — R4182 Altered mental status, unspecified: Secondary | ICD-10-CM

## 2013-03-26 DIAGNOSIS — G309 Alzheimer's disease, unspecified: Secondary | ICD-10-CM

## 2013-03-26 DIAGNOSIS — I10 Essential (primary) hypertension: Secondary | ICD-10-CM

## 2013-03-26 DIAGNOSIS — E860 Dehydration: Secondary | ICD-10-CM

## 2013-03-26 DIAGNOSIS — Z9181 History of falling: Secondary | ICD-10-CM

## 2013-03-26 DIAGNOSIS — I951 Orthostatic hypotension: Secondary | ICD-10-CM | POA: Diagnosis present

## 2013-03-26 DIAGNOSIS — R55 Syncope and collapse: Secondary | ICD-10-CM

## 2013-03-26 LAB — CBC
Hemoglobin: 11 g/dL — ABNORMAL LOW (ref 12.0–15.0)
MCH: 30.8 pg (ref 26.0–34.0)
MCV: 91.9 fL (ref 78.0–100.0)
Platelets: 130 10*3/uL — ABNORMAL LOW (ref 150–400)
RBC: 3.57 MIL/uL — ABNORMAL LOW (ref 3.87–5.11)

## 2013-03-26 LAB — BASIC METABOLIC PANEL
CO2: 30 mEq/L (ref 19–32)
Calcium: 8.8 mg/dL (ref 8.4–10.5)
Glucose, Bld: 88 mg/dL (ref 70–99)
Sodium: 139 mEq/L (ref 135–145)

## 2013-03-26 LAB — TSH: TSH: 1.155 u[IU]/mL (ref 0.350–4.500)

## 2013-03-26 LAB — RPR: RPR Ser Ql: NONREACTIVE

## 2013-03-26 MED ORDER — DIVALPROEX SODIUM 125 MG PO CPSP
125.0000 mg | ORAL_CAPSULE | Freq: Three times a day (TID) | ORAL | Status: DC
  Administered 2013-03-26: 125 mg via ORAL
  Filled 2013-03-26 (×3): qty 1

## 2013-03-26 MED ORDER — ASPIRIN EC 81 MG PO TBEC
81.0000 mg | DELAYED_RELEASE_TABLET | Freq: Every day | ORAL | Status: DC
  Administered 2013-03-26: 81 mg via ORAL
  Filled 2013-03-26: qty 1

## 2013-03-26 MED ORDER — LORAZEPAM 0.5 MG PO TABS: 0.5000 mg | ORAL_TABLET | Freq: Four times a day (QID) | ORAL | Status: DC | PRN

## 2013-03-26 MED ORDER — VENLAFAXINE HCL ER 75 MG PO CP24
75.0000 mg | ORAL_CAPSULE | Freq: Every day | ORAL | Status: DC
  Administered 2013-03-26: 75 mg via ORAL
  Filled 2013-03-26: qty 1

## 2013-03-26 MED ORDER — CHLORHEXIDINE GLUCONATE CLOTH 2 % EX PADS
6.0000 | MEDICATED_PAD | Freq: Every day | CUTANEOUS | Status: DC
  Administered 2013-03-26: 6 via TOPICAL

## 2013-03-26 MED ORDER — MUPIROCIN 2 % EX OINT
1.0000 "application " | TOPICAL_OINTMENT | Freq: Two times a day (BID) | CUTANEOUS | Status: DC
  Filled 2013-03-26 (×2): qty 22

## 2013-03-26 MED ORDER — SODIUM CHLORIDE 0.9 % IV BOLUS (SEPSIS)
500.0000 mL | Freq: Once | INTRAVENOUS | Status: AC
  Administered 2013-03-26: 500 mL via INTRAVENOUS

## 2013-03-26 MED ORDER — SODIUM CHLORIDE 0.9 % IJ SOLN
3.0000 mL | Freq: Two times a day (BID) | INTRAMUSCULAR | Status: DC
  Administered 2013-03-26 (×2): 3 mL via INTRAVENOUS

## 2013-03-26 MED ORDER — LEVOTHYROXINE SODIUM 100 MCG PO TABS
100.0000 ug | ORAL_TABLET | Freq: Every day | ORAL | Status: DC
  Administered 2013-03-26: 100 ug via ORAL
  Filled 2013-03-26 (×2): qty 1

## 2013-03-26 MED ORDER — ENSURE COMPLETE PO LIQD: 237.0000 mL | Freq: Two times a day (BID) | ORAL | Status: DC

## 2013-03-26 NOTE — Progress Notes (Signed)
CRITICAL VALUE ALERT  Critical value received:  + MRSA PCR  Date of notification:  03/26/13  Time of notification:  0512  Critical value read back:yes  Nurse who received alert:  Loney Hering  MD notified (1st page):  Standing orders initiated  Time of first page:    MD notified (2nd page):  Time of second page:  Responding MD:    Time MD responded:

## 2013-03-26 NOTE — Discharge Summary (Signed)
Physician Discharge Summary  JAYLEIGH NOTARIANNI ONG:[id number] DOB: 09/14/1926 DOA: 03/25/2013  PCP: Vista Mink, PA-C  Admit date: 03/25/2013 Discharge date: 03/26/2013  Recommendations for Outpatient Follow-up:  1. Return to dementia unit for ongoing care as before.   2. Please have clear conversation with family regarding goals of care including expected reasons for transfer to the hospital.   3. Continue OT/PT to maximize strength and endurance and balance 4. Supervising physician to please follow up on TSH, B12, and RPR which are pending at the time of discharge.  Discharge Diagnoses:  Principal Problem:   Frequent falls Active Problems:   HYPOTHYROIDISM   HYPERTENSION   Alzheimer disease   Orthostatic hypotension   Discharge Condition: stable, improved  Diet recommendation: dysphagia 2 with thin liquids and supplements.  No other restrictions  Wt Readings from Last 3 Encounters:  03/26/13 54.9 kg (121 lb 0.5 oz)  08/17/11 61 kg (134 lb 7.7 oz)  12/21/10 69.763 kg (153 lb 12.8 oz)    History of present illness:  79 yo female h/o moderate to advanced dementia lives at assisted living comes in for the second time this week with falls. Pt is suppose to use a walker, however due to her dementia, doesn't. Tonight she got up and fell against the wall, the staff heard her and responded, she was awake but had fallen against the wall. Pt really cannot provide accurate history. Her son is present right now. She says she gets "dizzy". He has not noticed any focal neurological deficits except the memory loss from her dementia. Her falls have become more frequent in the last several weeks. No fevers. No n/v/d. Some days she eats and drinks well and some days she does not. She has had unrevealing work up in the ED ruling out infectious source, ct head is unrevealing. ekg no significant arrythmias. We are being asked to admit patient per family request to "fix her falling". Orthostatics are  pending. Son says they have been backing off of some of her antipsychotic medications, other than that no other new meds.   Hospital Course:   Falls:  CT head demonstrated stable chronic atrophy without SDH.  Her CT spine demonstrated no fractures. Her Urinalysis, CBC and electrolytes were normal.  She received some IVF and may have been somewhat orthostatic.  Per the family, she does not eat well unless encouraged, however, they feel the staff have been doing a good job encouraging her.  Repeat orthostatics were negative and she worked with physical therapy who recommended falls precautions and ongoing PT/OT to maximize strength, stability, and endurance to hopefully reduce her falls risk.  The family is aware that this will be a progressive problem because of her dementia.    Dementia with recurrent falls.  No evidence of hydrocephalus on CT.  B12, TSH, and folate levels are pending.  Ammonia level was normal.  Family aware that she will likely have risk of aspiration and decreased oral intake leading to severe protein calorie malnutrition.  Discussed with the son that he should continue conversations with the medical staff about goals of care and expectations as these problems progressed.  He is aware of the risk of recurrent PNA and UTI and feels the facility does a good job screening for these problems.    Procedures:  CT head and neck  CXR  Consultations:  None  Discharge Exam: Filed Vitals:   03/26/13 1105  BP: 134/87  Pulse:   Temp:   Resp:  Filed Vitals:   03/26/13 0309 03/26/13 0312 03/26/13 1100 03/26/13 1105  BP: 123/76 102/68 135/89 134/87  Pulse: 83 91    Temp:      TempSrc:      Resp: 18 18    Height:      Weight:      SpO2: 100% 100%      General: thin CF, no acute distress HEENT:  MMM Cardiovascular: RRR, no mrg, 2+ pulses, warm extremities Respiratory: CTAB, no wrr or increased WOB ABD:  NABS, soft, ND/NT MSK:  No LEE Psych:  A&O to person only.   Neuro:  nonfocal  Discharge Instructions      Discharge Orders   Future Orders Complete By Expires   Call MD for:  difficulty breathing, headache or visual disturbances  As directed    Call MD for:  extreme fatigue  As directed    Call MD for:  hives  As directed    Call MD for:  persistant dizziness or light-headedness  As directed    Call MD for:  persistant nausea and vomiting  As directed    Call MD for:  severe uncontrolled pain  As directed    Call MD for:  temperature >100.4  As directed    Diet general  As directed    Comments:     Dysphagia 2 with thin liquids and supplements   Discharge instructions  As directed    Comments:     Continue falls precautions and close monitoring for underlying infections.  Continue conversation about goals of care and expectations.   Increase activity slowly  As directed        Medication List         aspirin EC 81 MG tablet  Take 81 mg by mouth daily.     CALCIUM + D PO  Take 600 mg by mouth 2 (two) times daily.     divalproex 125 MG capsule  Commonly known as:  DEPAKOTE SPRINKLE  Take 125 mg by mouth 3 (three) times daily.     feeding supplement Liqd  Take 237 mLs by mouth daily with supper.     ibuprofen 600 MG tablet  Commonly known as:  ADVIL,MOTRIN  Take 600 mg by mouth every 8 (eight) hours as needed for pain.     levothyroxine 100 MCG tablet  Commonly known as:  SYNTHROID, LEVOTHROID  Take 100 mcg by mouth daily.     LORazepam 0.5 MG tablet  Commonly known as:  ATIVAN  Take 1 tablet (0.5 mg total) by mouth every 6 (six) hours as needed for anxiety (or agitation).     losartan 25 MG tablet  Commonly known as:  COZAAR  Take 25 mg by mouth 2 (two) times daily.     NUVAIL Soln  Apply 1 application topically at bedtime. Applied to nails on both feet.     polyethylene glycol packet  Commonly known as:  MIRALAX / GLYCOLAX  Take 17 g by mouth daily as needed (for constipation).     rivastigmine 9.5 mg/24hr   Commonly known as:  EXELON  Place 1 patch onto the skin daily.     senna-docusate 8.6-50 MG per tablet  Commonly known as:  Senokot-S  Take 2 tablets by mouth daily.     ursodiol 300 MG capsule  Commonly known as:  ACTIGALL  Take 300 mg by mouth 3 (three) times daily.     venlafaxine XR 75 MG 24 hr capsule  Commonly  known as:  EFFEXOR-XR  Take 75 mg by mouth every morning.     vitamin B-12 1000 MCG tablet  Commonly known as:  CYANOCOBALAMIN  Take 1,000 mcg by mouth 2 (two) times daily.          The results of significant diagnostics from this hospitalization (including imaging, microbiology, ancillary and laboratory) are listed below for reference.    Significant Diagnostic Studies: Ct Head Wo Contrast  03/25/2013   *RADIOLOGY REPORT*  Clinical Data:  Status post fall with head injury.  Altered mental status.  CT HEAD WITHOUT CONTRAST CT CERVICAL SPINE WITHOUT CONTRAST  Technique:  Multidetector CT imaging of the head and cervical spine was performed following the standard protocol without intravenous contrast.  Multiplanar CT image reconstructions of the cervical spine were also generated.  Comparison:  Head CT 02/02/2013.  CT HEAD  Findings: The patient's head is tilted within the CT gantry. Chronic atrophy and a chronic left cerebellar infarct are stable. There is no evidence of acute intracranial hemorrhage, mass lesion, brain edema or extra-axial fluid collection.  There is no CT evidence of acute stroke.  The visualized paranasal sinuses, mastoid air cells and middle ears are clear.  The calvarium is intact.  IMPRESSION: Stable head CT.  No acute intracranial or calvarial findings.  CT CERVICAL SPINE  Findings: There is no evidence of acute cervical spine fracture or traumatic subluxation.  There is straightening of the usual cervical lordosis with a grade 1 degenerative anterolisthesis at C3- C4 and C4-C5.  The facet joints appear fused on the right at C3-C5 and on the left at  C3-C4.  There is multilevel disc space loss with posterior osteophytes, greatest at C5-C6 and C6-C7.  No acute soft tissue abnormalities are identified.  Carotid artery calcifications and mild biapical pulmonary scarring are noted.  IMPRESSION:  1.  No evidence of acute cervical spine fracture, traumatic subluxation or static signs instability. 2.  Moderate multilevel spondylosis as described.   Original Report Authenticated By: Carey Bullocks, M.D.   Ct Cervical Spine Wo Contrast  03/25/2013   *RADIOLOGY REPORT*  Clinical Data:  Status post fall with head injury.  Altered mental status.  CT HEAD WITHOUT CONTRAST CT CERVICAL SPINE WITHOUT CONTRAST  Technique:  Multidetector CT imaging of the head and cervical spine was performed following the standard protocol without intravenous contrast.  Multiplanar CT image reconstructions of the cervical spine were also generated.  Comparison:  Head CT 02/02/2013.  CT HEAD  Findings: The patient's head is tilted within the CT gantry. Chronic atrophy and a chronic left cerebellar infarct are stable. There is no evidence of acute intracranial hemorrhage, mass lesion, brain edema or extra-axial fluid collection.  There is no CT evidence of acute stroke.  The visualized paranasal sinuses, mastoid air cells and middle ears are clear.  The calvarium is intact.  IMPRESSION: Stable head CT.  No acute intracranial or calvarial findings.  CT CERVICAL SPINE  Findings: There is no evidence of acute cervical spine fracture or traumatic subluxation.  There is straightening of the usual cervical lordosis with a grade 1 degenerative anterolisthesis at C3- C4 and C4-C5.  The facet joints appear fused on the right at C3-C5 and on the left at C3-C4.  There is multilevel disc space loss with posterior osteophytes, greatest at C5-C6 and C6-C7.  No acute soft tissue abnormalities are identified.  Carotid artery calcifications and mild biapical pulmonary scarring are noted.  IMPRESSION:  1.  No  evidence of  acute cervical spine fracture, traumatic subluxation or static signs instability. 2.  Moderate multilevel spondylosis as described.   Original Report Authenticated By: Carey Bullocks, M.D.   Dg Chest Portable 1 View  03/25/2013   *RADIOLOGY REPORT*  Clinical Data: Trauma from a fall.  PORTABLE CHEST - 1 VIEW  Comparison: Chest x-ray 08/16/2011.  Findings: Focal area of scarring in the left mid lung is unchanged. Mild diffuse peribronchial cuffing appears to be chronic. Lung volumes are normal.  No consolidative airspace disease.  No pleural effusions.  No pneumothorax.  No pulmonary nodule or mass noted. Pulmonary vasculature and the cardiomediastinal silhouette are within normal limits.  Atherosclerosis of the thoracic aorta. Surgical clips overlying the left breast.  IMPRESSION: 1. No radiographic evidence of acute cardiopulmonary disease. 2.  Atherosclerosis. 3.  Chronic scarring in the left midlung is unchanged. 4.  Mild diffuse peribronchial cuffing, similar to prior examinations, presumably related to chronic bronchitis.   Original Report Authenticated By: Trudie Reed, M.D.    Microbiology: Recent Results (from the past 240 hour(s))  MRSA PCR SCREENING     Status: Abnormal   Collection Time    03/26/13  3:08 AM      Result Value Range Status   MRSA by PCR POSITIVE (*) NEGATIVE Final   Comment:            The GeneXpert MRSA Assay (FDA     approved for NASAL specimens     only), is one component of a     comprehensive MRSA colonization     surveillance program. It is not     intended to diagnose MRSA     infection nor to guide or     monitor treatment for     MRSA infections.     RESULT CALLED TO, READ BACK BY AND VERIFIED WITH:     YOUNG,S RN [id number] AT 0510 SKEEN,P     Labs: Basic Metabolic Panel:  Recent Labs Lab 03/25/13 2210 03/26/13 0455  NA 136 139  K 4.2 3.8  CL 99 102  CO2 34* 30  GLUCOSE 87 88  BUN 29* 23  CREATININE 0.99 0.79  CALCIUM 9.3 8.8    Liver Function Tests: No results found for this basename: AST, ALT, ALKPHOS, BILITOT, PROT, ALBUMIN,  in the last 168 hours No results found for this basename: LIPASE, AMYLASE,  in the last 168 hours  Recent Labs Lab 03/25/13 2220  AMMONIA 10*   CBC:  Recent Labs Lab 03/25/13 2210 03/26/13 0455  WBC 5.7 5.2  HGB 11.1* 11.0*  HCT 33.6* 32.8*  MCV 92.1 91.9  PLT 138* 130*   Cardiac Enzymes:  Recent Labs Lab 03/25/13 2210  TROPONINI <0.30   BNP: BNP (last 3 results) No results found for this basename: PROBNP,  in the last 8760 hours CBG: No results found for this basename: GLUCAP,  in the last 168 hours  Time coordinating discharge: 45 minutes  Signed:  Mechelle Pates  [facility] 03/26/2013, 1:23 PM

## 2013-03-26 NOTE — Progress Notes (Signed)
Called report to [facility] re: patient - no real changes. Gave report to Crystal. Family aware of transfer and transport has been scheduled for 3:00pm. Pt belongings sent home with pt (sister took home pants to wash). Otherwise on RA. IV d/c'd.   Delynn Flavin, RN

## 2013-03-26 NOTE — Evaluation (Signed)
Physical Therapy Evaluation Patient Details Name: [name] Briggs MRN: [id number] DOB: Aug 10, 1927 Today's Date: 03/26/2013 Time: 4098-1191 PT Time Calculation (min): 23 min  PT Assessment / Plan / Recommendation History of Present Illness  Patient is an 79 y/o female with Alzheimer's dementia admitted from ALF with frequent falls.  Clinical Impression  Patient presents with high fall risk more due to rigidity and dementia.  She may benefit from HHPT services at ALF at d/c.  Will need falls assessment and safety plan at facility.  No further acute needs as likely d/c back to facility today.    PT Assessment  All further PT needs can be met in the next venue of care    Follow Up Recommendations  Home health PT (at ALF)          Equipment Recommendations  None recommended by PT          Precautions / Restrictions Precautions Precautions: Fall   Pertinent Vitals/Pain No pain complaints      Mobility  Bed Mobility Bed Mobility: Supine to Sit Supine to Sit: 3: Mod assist Transfers Sit to Stand: 4: Min guard;With upper extremity assist;From bed Stand to Sit: To chair/3-in-1;With upper extremity assist;4: Min assist;To toilet;3: Mod assist Details for Transfer Assistance: increased assist to toilet due to decreased cognition, needed guiding to find seat with hand prior to sitting; needed assist for safety to sit on recliner due to turning to sit while sitting and high fall risk Ambulation/Gait Ambulation/Gait Assistance: 4: Min guard;5: Supervision Ambulation Distance (Feet): 150 Feet Assistive device: None Ambulation/Gait Assistance Details: one loss of balance looking to right with min assist recovery Gait Pattern: Step-through pattern;Shuffle;Decreased stride length;Narrow base of support;Decreased trunk rotation;Lateral trunk lean to right        PT Diagnosis: Abnormality of gait  PT Problem List: Decreased balance;Decreased mobility;Decreased safety awareness;Decreased  range of motion PT Treatment Interventions:       PT Goals(Current goals can be found in the care plan section) Acute Rehab PT Goals Patient Stated Goal: To help her walk safer PT Goal Formulation: No goals set, d/c therapy  Visit Information  Last PT Received On: 03/26/13 Assistance Needed: +1 History of Present Illness: Patient is an 79 y/o female with Alzheimer's dementia admitted from ALF with frequent falls.       Prior Functioning  Home Living Family/patient expects to be discharged to:: Assisted living Prior Function Level of Independence: Needs assistance Gait / Transfers Assistance Needed: HHA ADL's / Homemaking Assistance Needed: Max A Communication Communication: Other (comment) (deficits assoc with dementia)    Cognition  Cognition Arousal/Alertness: Awake/alert Behavior During Therapy: Flat affect Overall Cognitive Status: History of cognitive impairments - at baseline    Extremity/Trunk Assessment Upper Extremity Assessment Upper Extremity Assessment: Generalized weakness Lower Extremity Assessment Lower Extremity Assessment: Generalized weakness Cervical / Trunk Assessment Cervical / Trunk Assessment: Kyphotic (Left lateral lean)   Balance Balance Balance Assessed: Yes Static Standing Balance Static Standing - Balance Support: No upper extremity supported Static Standing - Level of Assistance: 5: Stand by assistance;4: Min assist Static Standing - Comment/# of Minutes: static standing initially with posterior and right bias, improved with function to be supervision  End of Session PT - End of Session Equipment Utilized During Treatment: Gait belt Activity Tolerance: Patient tolerated treatment well Patient left: in chair;with family/visitor present  GP Functional Assessment Tool Used: Clinical Judgement Functional Limitation: Mobility: Walking and moving around Mobility: Walking and Moving Around Current Status (Y7829): At least  20 percent but less  than 40 percent impaired, limited or restricted Mobility: Walking and Moving Around Goal Status [id number]): At least 20 percent but less than 40 percent impaired, limited or restricted Mobility: Walking and Moving Around Discharge Status [phone]): At least 20 percent but less than 40 percent impaired, limited or restricted   [facility] 03/26/2013, 2:10 PM [place], [place] [phone] 03/26/2013

## 2013-03-26 NOTE — Progress Notes (Signed)
Clinical Social Worker facilitated patient discharge by contacting the patient, sister(at bedside) and facility, [facility]. Sister agreeable to this plan and arranging transport via EMS . Sister states she will call patients son to let him know dc plan. CSW will sign off, as social work intervention is no longer needed.  Maree Krabbe, MSW, Theresia Majors [phone]

## 2013-03-26 NOTE — Progress Notes (Signed)
Occupational Therapy Evaluation Patient Details Name: [name] Briggs MRN: [id number] DOB: Jul 17, 1927 Today's Date: 03/26/2013 Time: 1478-2956 OT Time Calculation (min): 25 min  OT Assessment / Plan / Recommendation History of present illness  79 yo with dementia with recent falls.   Clinical Impression   Pt presents at baseline level of functioning. Pt not orthostatic. Ambulates with HHA. Mx A with ADL. Discussed disease process with son. Progression of dementia will increase risk of falls. Recommend that pt D/C back to [facility] and facility involve pt and family in fall prevention program, including use of OT/PT . If medically stable, pt appropriate for D/C today.     OT Assessment  All further OT needs can be met in the next venue of care    Follow Up Recommendations  Supervision/Assistance - 24 hour;Other (comment) (ALF - [place] [facility])    Barriers to Discharge      Equipment Recommendations  None recommended by OT    Recommendations for Other Services    Frequency       Precautions / Restrictions Precautions Precautions: Fall   Pertinent Vitals/Pain BP 135/89 sitting. 135/87 standing    ADL  ADL Comments: At baseline level of function. Max A with all ADL na dHHA with mobility    OT Diagnosis: Generalized weakness;Cognitive deficits  OT Problem List: Decreased cognition;Other (comment) (balance) OT Treatment Interventions:     OT Goals(Current goals can be found in the care plan section)    Visit Information  Last OT Received On: 03/26/13 Assistance Needed: +1 PT/OT Co-Evaluation/Treatment: Yes       Prior Functioning     Home Living Family/patient expects to be discharged to:: Assisted living Prior Function Level of Independence: Needs assistance Gait / Transfers Assistance Needed: HHA ADL's / Homemaking Assistance Needed: Max A Communication Communication: Other (comment) (deficits associated with dementiat)         Vision/Perception      Cognition  Cognition Arousal/Alertness: Awake/alert Behavior During Therapy: Flat affect Overall Cognitive Status: History of cognitive impairments - at baseline    Extremity/Trunk Assessment Upper Extremity Assessment Upper Extremity Assessment: Generalized weakness Lower Extremity Assessment Lower Extremity Assessment: Defer to PT evaluation Cervical / Trunk Assessment Cervical / Trunk Assessment: Kyphotic;Other exceptions (L bias)     Mobility Bed Mobility Bed Mobility: Supine to Sit Supine to Sit: 3: Mod assist Transfers Transfers: Sit to Stand;Stand to Sit Sit to Stand: 4: Min guard;With upper extremity assist;From bed Stand to Sit: 4: Min guard;To chair/3-in-1;With upper extremity assist     Exercise     Balance     End of Session OT - End of Session Equipment Utilized During Treatment: Gait belt Activity Tolerance: Patient tolerated treatment well Patient left: in chair;with call bell/phone within reach;with family/visitor present Nurse Communication: Mobility status;Other (comment) (POC)  GO Functional Assessment Tool Used: clinical judgement Functional Limitation: Self care Self Care Current Status (O1308): At least 80 percent but less than 100 percent impaired, limited or restricted Self Care Goal Status (M5784): At least 80 percent but less than 100 percent impaired, limited or restricted Self Care Discharge Status [phone]): At least 80 percent but less than 100 percent impaired, limited or restricted   Aubrie Lucien,HILLARY 03/26/2013, 12:01 PM [facility], OTR/L  [phone] 03/26/2013

## 2013-03-26 NOTE — Progress Notes (Signed)
Clinical Social Work Department BRIEF PSYCHOSOCIAL ASSESSMENT 03/26/2013  Patient:  [name] Briggs, [name] Briggs     Account Number:  [id number]     Admit date:  03/25/2013  Clinical Social Worker:  Carren Rang  Date/Time:  03/26/2013 02:33 PM  Referred by:  Care Management  Date Referred:  03/26/2013 Referred for  ALF Placement   Other Referral:   Interview type:  Family Other interview type:   Sister at bedside    PSYCHOSOCIAL DATA Living Status:  FACILITY Admitted from facility:  [facility] Level of care:  Assisted Living Primary support name:  Kadeisha Betsch Primary support relationship to patient:  CHILD, ADULT Degree of support available:   Good    CURRENT CONCERNS Current Concerns  Post-Acute Placement   Other Concerns:    SOCIAL WORK ASSESSMENT / PLAN CSW received referral for ALF placement. Patient is from [facility]. CSW introduced self and explained reason for visit. Patient sleeping, sister at bedside. Sister states she is keeping her sister company and is updating patients son. Doctor states that patient is ready to return back to ALF. CSW explained to sister. Sister stated she preferred transportation from EMS since her sister keeps falling. CSW will call transport and set up dc plan.   Assessment/plan status:  No Further Intervention Required Other assessment/ plan:   Information/referral to community resources:   ALF    PATIENT'S/FAMILY'S RESPONSE TO PLAN OF CARE: Patients sister states she will follow behind EMS to [facility] to be with patient. Sister states she will call patients son to update him.       Maree Krabbe, MSW, Theresia Majors [phone]

## 2013-03-26 NOTE — ED Notes (Signed)
Foley remains, RN is going to talk to Admitting about keeping foley.

## 2013-03-26 NOTE — H&P (Signed)
PCP:   MORAITIS, LAURA B, PA-C   Chief Complaint:  Frequent falls  HPI: 79 yo female h/o moderate to advanced dementia lives at assisted living comes in for the second time this week with falls.  Pt is suppose to use a walker, however due to her dementia, doesn't.  Tonight she got up and fell against the wall, the staff heard her and responded, she was awake but had fallen against the wall.  Pt really cannot provide accurate history.  Her son is present right now.  She says she gets "dizzy".  He has not noticed any focal neurological deficits except the memory loss from her dementia.  Her falls have become more frequent in the last several weeks.  No fevers.  No n/v/d.  Some days she eats and drinks well and some days she does not.  She has had unrevealing work up in the ED ruling out infectious source, ct head is unrevealing.  ekg no significant arrythmias.  We are being asked to admit patient per family request to "fix her falling".  Orthostatics are pending.  Son says they have been backing off of some of her antipsychotic medications, other than that no other new meds.  Review of Systems:  Unobtainable from patient  Past Medical History: Past Medical History  Diagnosis Date  . Hypertension   . Alzheimer disease   . Depression   . Breast cancer   . Sleep apnea   . Hypothyroidism   . Anxiety   . Syncope and collapse   . Sinus bradycardia   . UTI (urinary tract infection)     ACUTE  . Dehydration     ACUTE  . Bradycardia    Past Surgical History  Procedure Laterality Date  . Cholecystectomy    . Breast surgery      bilteral mastectomy's    Medications: Prior to Admission medications   Medication Sig Start Date End Date Taking? Authorizing Provider  aspirin EC 81 MG tablet Take 81 mg by mouth daily.   Yes Historical Provider, MD  Calcium Carbonate-Vitamin D (CALCIUM + D PO) Take 600 mg by mouth 2 (two) times daily.     Yes Historical Provider, MD  Dermatological Products,  Misc. (NUVAIL) SOLN Apply 1 application topically at bedtime. Applied to nails on both feet.   Yes Historical Provider, MD  divalproex (DEPAKOTE SPRINKLE) 125 MG capsule Take 125 mg by mouth 3 (three) times daily.   Yes Historical Provider, MD  feeding supplement (ENSURE IMMUNE HEALTH) LIQD Take 237 mLs by mouth daily with supper.   Yes Historical Provider, MD  ibuprofen (ADVIL,MOTRIN) 600 MG tablet Take 600 mg by mouth every 8 (eight) hours as needed for pain.   Yes Historical Provider, MD  levothyroxine (SYNTHROID, LEVOTHROID) 100 MCG tablet Take 100 mcg by mouth daily.    Yes Historical Provider, MD  LORazepam (ATIVAN) 0.5 MG tablet Take 1 tablet (0.5 mg total) by mouth every 6 (six) hours as needed for anxiety (or agitation). 03/13/13  Yes Mahima Glade Lloyd, MD  losartan (COZAAR) 25 MG tablet Take 25 mg by mouth 2 (two) times daily.     Yes Historical Provider, MD  polyethylene glycol (MIRALAX / GLYCOLAX) packet Take 17 g by mouth daily as needed (for constipation).   Yes Historical Provider, MD  rivastigmine (EXELON) 9.5 mg/24hr Place 1 patch onto the skin daily.   Yes Historical Provider, MD  senna-docusate (SENOKOT-S) 8.6-50 MG per tablet Take 2 tablets by mouth daily.  Yes Historical Provider, MD  ursodiol (ACTIGALL) 300 MG capsule Take 300 mg by mouth 3 (three) times daily.    Yes Historical Provider, MD  venlafaxine XR (EFFEXOR-XR) 75 MG 24 hr capsule Take 75 mg by mouth every morning.   Yes Historical Provider, MD  vitamin B-12 (CYANOCOBALAMIN) 1000 MCG tablet Take 1,000 mcg by mouth 2 (two) times daily.     Yes Historical Provider, MD    Allergies:   Allergies  Allergen Reactions  . Ace Inhibitors     REACTION: Swelling  . Bystolic [Nebivolol Hcl] Other (See Comments)    Bradycardia  . Ciprofloxacin     REACTION: GI Upset    Social History:  reports that she quit smoking about 25 years ago. She has never used smokeless tobacco. She reports that she does not drink alcohol or use  illicit drugs.  Family History: Family History  Problem Relation Age of Onset  . Heart failure Mother   . Parkinsonism Father     Physical Exam: Filed Vitals:   03/25/13 2300 03/26/13 0124 03/26/13 0124 03/26/13 0124  BP: 145/76 164/83 135/82 110/74  Pulse: 63 71 77 92  Temp:      TempSrc:      Resp: 20     SpO2: 99%      General appearance: alert, cooperative and no distress Head: Normocephalic, without obvious abnormality, atraumatic Eyes: negative Nose: Nares normal. Septum midline. Mucosa normal. No drainage or sinus tenderness. Neck: no JVD and supple, symmetrical, trachea midline Lungs: clear to auscultation bilaterally Heart: regular rate and rhythm, S1, S2 normal, no murmur, click, rub or gallop Abdomen: soft, non-tender; bowel sounds normal; no masses,  no organomegaly Extremities: extremities normal, atraumatic, no cyanosis or edema Pulses: 2+ and symmetric Skin: Skin color, texture, turgor normal. No rashes or lesions Neurologic: Grossly normal  Labs on Admission:   Recent Labs  03/25/13 2210  NA 136  K 4.2  CL 99  CO2 34*  GLUCOSE 87  BUN 29*  CREATININE 0.99  CALCIUM 9.3    Recent Labs  03/25/13 2210  WBC 5.7  HGB 11.1*  HCT 33.6*  MCV 92.1  PLT 138*    Recent Labs  03/25/13 2210  TROPONINI <0.30    Radiological Exams on Admission: Ct Head Wo Contrast  03/25/2013   *RADIOLOGY REPORT*  Clinical Data:  Status post fall with head injury.  Altered mental status.  CT HEAD WITHOUT CONTRAST CT CERVICAL SPINE WITHOUT CONTRAST  Technique:  Multidetector CT imaging of the head and cervical spine was performed following the standard protocol without intravenous contrast.  Multiplanar CT image reconstructions of the cervical spine were also generated.  Comparison:  Head CT 02/02/2013.  CT HEAD  Findings: The patient's head is tilted within the CT gantry. Chronic atrophy and a chronic left cerebellar infarct are stable. There is no evidence of acute  intracranial hemorrhage, mass lesion, brain edema or extra-axial fluid collection.  There is no CT evidence of acute stroke.  The visualized paranasal sinuses, mastoid air cells and middle ears are clear.  The calvarium is intact.  IMPRESSION: Stable head CT.  No acute intracranial or calvarial findings.  CT CERVICAL SPINE  Findings: There is no evidence of acute cervical spine fracture or traumatic subluxation.  There is straightening of the usual cervical lordosis with a grade 1 degenerative anterolisthesis at C3- C4 and C4-C5.  The facet joints appear fused on the right at C3-C5 and on the left at C3-C4.  There is multilevel disc space loss with posterior osteophytes, greatest at C5-C6 and C6-C7.  No acute soft tissue abnormalities are identified.  Carotid artery calcifications and mild biapical pulmonary scarring are noted.  IMPRESSION:  1.  No evidence of acute cervical spine fracture, traumatic subluxation or static signs instability. 2.  Moderate multilevel spondylosis as described.   Original Report Authenticated By: Carey Bullocks, M.D.   Ct Cervical Spine Wo Contrast  03/25/2013   *RADIOLOGY REPORT*  Clinical Data:  Status post fall with head injury.  Altered mental status.  CT HEAD WITHOUT CONTRAST CT CERVICAL SPINE WITHOUT CONTRAST  Technique:  Multidetector CT imaging of the head and cervical spine was performed following the standard protocol without intravenous contrast.  Multiplanar CT image reconstructions of the cervical spine were also generated.  Comparison:  Head CT 02/02/2013.  CT HEAD  Findings: The patient's head is tilted within the CT gantry. Chronic atrophy and a chronic left cerebellar infarct are stable. There is no evidence of acute intracranial hemorrhage, mass lesion, brain edema or extra-axial fluid collection.  There is no CT evidence of acute stroke.  The visualized paranasal sinuses, mastoid air cells and middle ears are clear.  The calvarium is intact.  IMPRESSION: Stable head  CT.  No acute intracranial or calvarial findings.  CT CERVICAL SPINE  Findings: There is no evidence of acute cervical spine fracture or traumatic subluxation.  There is straightening of the usual cervical lordosis with a grade 1 degenerative anterolisthesis at C3- C4 and C4-C5.  The facet joints appear fused on the right at C3-C5 and on the left at C3-C4.  There is multilevel disc space loss with posterior osteophytes, greatest at C5-C6 and C6-C7.  No acute soft tissue abnormalities are identified.  Carotid artery calcifications and mild biapical pulmonary scarring are noted.  IMPRESSION:  1.  No evidence of acute cervical spine fracture, traumatic subluxation or static signs instability. 2.  Moderate multilevel spondylosis as described.   Original Report Authenticated By: Carey Bullocks, M.D.   Dg Chest Portable 1 View  03/25/2013   *RADIOLOGY REPORT*  Clinical Data: Trauma from a fall.  PORTABLE CHEST - 1 VIEW  Comparison: Chest x-ray 08/16/2011.  Findings: Focal area of scarring in the left mid lung is unchanged. Mild diffuse peribronchial cuffing appears to be chronic. Lung volumes are normal.  No consolidative airspace disease.  No pleural effusions.  No pneumothorax.  No pulmonary nodule or mass noted. Pulmonary vasculature and the cardiomediastinal silhouette are within normal limits.  Atherosclerosis of the thoracic aorta. Surgical clips overlying the left breast.  IMPRESSION: 1. No radiographic evidence of acute cardiopulmonary disease. 2.  Atherosclerosis. 3.  Chronic scarring in the left midlung is unchanged. 4.  Mild diffuse peribronchial cuffing, similar to prior examinations, presumably related to chronic bronchitis.   Original Report Authenticated By: Trudie Reed, M.D.    Assessment/Plan  79 yo female with frequent falls likely due to orthostatic hypotension Principal Problem:   Frequent falls Active Problems:   HYPOTHYROIDISM   HYPERTENSION   Alzheimer disease   Orthostatic  hypotension  obs overnight. Hold cozaar.  Give small ivf bolus.  Have explained to son that her fall risk will always be high, but will try to minimize the risk as much as possible.  No source of infection at this time.  Exam is nonfocal.  Will monitor on tele overnight for any arythmias.  Pt is DNR.  obs on tele.    Terecia Plaut A 03/26/2013, 1:51 AM

## 2013-03-27 LAB — URINE CULTURE

## 2013-03-27 NOTE — ED Provider Notes (Signed)
I have personally seen and examined the patient.  I have discussed the plan of care with the resident.  I have reviewed the documentation on PMH/FH/Soc. History.  I have reviewed the documentation of the resident and agree.  I have reviewed and agree with the ECG interpretation(s) documented by the resident.  Pt with recent multiple falls.  On my assessment history is difficult and she appears altered hospitalist consulted for admission   Joya Gaskins, MD 03/27/13 0101

## 2013-03-29 ENCOUNTER — Emergency Department (HOSPITAL_COMMUNITY)
Admission: EM | Admit: 2013-03-29 | Discharge: 2013-03-29 | Disposition: A | Discharge status: Home / Self Care | Payer: Medicare Other | Attending: Emergency Medicine | Admitting: Emergency Medicine

## 2013-03-29 ENCOUNTER — Emergency Department (HOSPITAL_COMMUNITY): Payer: Medicare Other

## 2013-03-29 DIAGNOSIS — Z87891 Personal history of nicotine dependence: Secondary | ICD-10-CM | POA: Insufficient documentation

## 2013-03-29 DIAGNOSIS — F329 Major depressive disorder, single episode, unspecified: Secondary | ICD-10-CM | POA: Insufficient documentation

## 2013-03-29 DIAGNOSIS — Z7982 Long term (current) use of aspirin: Secondary | ICD-10-CM | POA: Insufficient documentation

## 2013-03-29 DIAGNOSIS — Z87440 Personal history of urinary (tract) infections: Secondary | ICD-10-CM | POA: Insufficient documentation

## 2013-03-29 DIAGNOSIS — G4730 Sleep apnea, unspecified: Secondary | ICD-10-CM | POA: Insufficient documentation

## 2013-03-29 DIAGNOSIS — G309 Alzheimer's disease, unspecified: Secondary | ICD-10-CM | POA: Insufficient documentation

## 2013-03-29 DIAGNOSIS — F3289 Other specified depressive episodes: Secondary | ICD-10-CM | POA: Insufficient documentation

## 2013-03-29 DIAGNOSIS — W19XXXA Unspecified fall, initial encounter: Secondary | ICD-10-CM | POA: Insufficient documentation

## 2013-03-29 DIAGNOSIS — Y9210 Unspecified residential institution as the place of occurrence of the external cause: Secondary | ICD-10-CM | POA: Insufficient documentation

## 2013-03-29 DIAGNOSIS — Z8679 Personal history of other diseases of the circulatory system: Secondary | ICD-10-CM | POA: Insufficient documentation

## 2013-03-29 DIAGNOSIS — Z853 Personal history of malignant neoplasm of breast: Secondary | ICD-10-CM | POA: Insufficient documentation

## 2013-03-29 DIAGNOSIS — F411 Generalized anxiety disorder: Secondary | ICD-10-CM | POA: Insufficient documentation

## 2013-03-29 DIAGNOSIS — Y9301 Activity, walking, marching and hiking: Secondary | ICD-10-CM | POA: Insufficient documentation

## 2013-03-29 DIAGNOSIS — I10 Essential (primary) hypertension: Secondary | ICD-10-CM | POA: Insufficient documentation

## 2013-03-29 DIAGNOSIS — F0280 Dementia in other diseases classified elsewhere without behavioral disturbance: Secondary | ICD-10-CM | POA: Insufficient documentation

## 2013-03-29 DIAGNOSIS — E039 Hypothyroidism, unspecified: Secondary | ICD-10-CM | POA: Insufficient documentation

## 2013-03-29 NOTE — ED Provider Notes (Signed)
CSN: [id number]     Arrival date & time 03/29/13  1155 History     First MD Initiated Contact with Patient 03/29/13 1250     Chief Complaint  Patient presents with  . Fall   (Consider location/radiation/quality/duration/timing/severity/associated sxs/prior Treatment) Patient is a 79 y.o. female presenting with fall. The history is provided by the patient and a relative.  Fall This is a new problem. The current episode started today. Associated symptoms comments: Unwitnessed fall while walking in the hall at the memory care unit where she lives. The patient denies any current pain. Son and sister are at bedside and report that she is having an increased number of falls recently, this being the 3rd time this week. They further report that her physician is changing her medications and this is felt by family to be the reason for increased instability. .    Past Medical History  Diagnosis Date  . Hypertension   . Alzheimer disease   . Depression   . Breast cancer   . Sleep apnea   . Hypothyroidism   . Anxiety   . Syncope and collapse   . Sinus bradycardia   . UTI (urinary tract infection)     ACUTE  . Dehydration     ACUTE  . Bradycardia    Past Surgical History  Procedure Laterality Date  . Cholecystectomy    . Breast surgery      bilteral mastectomy's   Family History  Problem Relation Age of Onset  . Heart failure Mother   . Parkinsonism Father    History  Substance Use Topics  . Smoking status: Former Smoker    Quit date: 12/21/1987  . Smokeless tobacco: Never Used  . Alcohol Use: No   OB History   Grav Para Term Preterm Abortions TAB SAB Ect Mult Living                 Review of Systems  Unable to perform ROS: Dementia    Allergies  Ace inhibitors; Bystolic; and Ciprofloxacin  Home Medications   Current Outpatient Rx  Name  Route  Sig  Dispense  Refill  . aspirin EC 81 MG tablet   Oral   Take 81 mg by mouth daily.         . Calcium  Carbonate-Vitamin D (CALCIUM + D PO)   Oral   Take 600 mg by mouth 2 (two) times daily.           . Dermatological Products, Misc. (NUVAIL) SOLN   Topical   Apply 1 application topically at bedtime. Applied to nails on both feet.         . divalproex (DEPAKOTE SPRINKLE) 125 MG capsule   Oral   Take 125 mg by mouth 3 (three) times daily.         . feeding supplement (ENSURE IMMUNE HEALTH) LIQD   Oral   Take 237 mLs by mouth daily with supper.         . levothyroxine (SYNTHROID, LEVOTHROID) 100 MCG tablet   Oral   Take 100 mcg by mouth daily.          Marland Kitchen LORazepam (ATIVAN) 0.5 MG tablet   Oral   Take 1 tablet (0.5 mg total) by mouth every 6 (six) hours as needed for anxiety (or agitation).   120 tablet   5   . losartan (COZAAR) 25 MG tablet   Oral   Take 25 mg by mouth 2 (two) times daily.           Marland Kitchen  rivastigmine (EXELON) 9.5 mg/24hr   Transdermal   Place 1 patch onto the skin daily.         Marland Kitchen senna-docusate (SENOKOT-S) 8.6-50 MG per tablet   Oral   Take 2 tablets by mouth daily.         . ursodiol (ACTIGALL) 300 MG capsule   Oral   Take 300 mg by mouth 3 (three) times daily.          Marland Kitchen venlafaxine XR (EFFEXOR-XR) 75 MG 24 hr capsule   Oral   Take 75 mg by mouth every morning.         . vitamin B-12 (CYANOCOBALAMIN) 1000 MCG tablet   Oral   Take 1,000 mcg by mouth 2 (two) times daily.           Marland Kitchen ibuprofen (ADVIL,MOTRIN) 600 MG tablet   Oral   Take 600 mg by mouth every 8 (eight) hours as needed for pain.         . polyethylene glycol (MIRALAX / GLYCOLAX) packet   Oral   Take 17 g by mouth daily as needed (for constipation).          BP 142/72  Pulse 65  Temp(Src) 98.4 F (36.9 C) (Oral)  Resp 18  SpO2 97% Physical Exam  Constitutional: She appears well-developed and well-nourished.  HENT:  Head: Normocephalic.  Neck: Normal range of motion. Neck supple.  Cardiovascular: Normal rate and regular rhythm.   Pulmonary/Chest:  Effort normal and breath sounds normal.  Abdominal: Soft. Bowel sounds are normal. There is no tenderness. There is no rebound and no guarding.  Musculoskeletal: Normal range of motion. She exhibits no edema.  Neurological: She is alert. No cranial nerve deficit.  Skin: Skin is warm and dry. No rash noted.  Psychiatric: She has a normal mood and affect.    ED Course   Procedures (including critical care time) Dg Cervical Spine Complete  03/29/2013   *RADIOLOGY REPORT*  Clinical Data: fall, neck pain, dementia, films taken on stretcher in c-collar  CERVICAL SPINE - COMPLETE 4+ VIEW  Comparison: 03/25/2013  Findings: Reversed lordosis possibly due to presence of collar.  No prevertebral soft tissue swelling.  No significant listhesis.  No fractures identified.  Severe degenerative disc disease throughout the mid to lower cervical spine.  IMPRESSION: Reversed lordosis likely due to presence of taller.  Severe degenerative changes.  No fractures.   Original Report Authenticated By: Esperanza Heir, M.D.   Ct Head Wo Contrast  03/25/2013   *RADIOLOGY REPORT*  Clinical Data:  Status post fall with head injury.  Altered mental status.  CT HEAD WITHOUT CONTRAST CT CERVICAL SPINE WITHOUT CONTRAST  Technique:  Multidetector CT imaging of the head and cervical spine was performed following the standard protocol without intravenous contrast.  Multiplanar CT image reconstructions of the cervical spine were also generated.  Comparison:  Head CT 02/02/2013.  CT HEAD  Findings: The patient's head is tilted within the CT gantry. Chronic atrophy and a chronic left cerebellar infarct are stable. There is no evidence of acute intracranial hemorrhage, mass lesion, brain edema or extra-axial fluid collection.  There is no CT evidence of acute stroke.  The visualized paranasal sinuses, mastoid air cells and middle ears are clear.  The calvarium is intact.  IMPRESSION: Stable head CT.  No acute intracranial or calvarial  findings.  CT CERVICAL SPINE  Findings: There is no evidence of acute cervical spine fracture or traumatic subluxation.  There is straightening of  the usual cervical lordosis with a grade 1 degenerative anterolisthesis at C3- C4 and C4-C5.  The facet joints appear fused on the right at C3-C5 and on the left at C3-C4.  There is multilevel disc space loss with posterior osteophytes, greatest at C5-C6 and C6-C7.  No acute soft tissue abnormalities are identified.  Carotid artery calcifications and mild biapical pulmonary scarring are noted.  IMPRESSION:  1.  No evidence of acute cervical spine fracture, traumatic subluxation or static signs instability. 2.  Moderate multilevel spondylosis as described.   Original Report Authenticated By: Carey Bullocks, M.D.   Ct Cervical Spine Wo Contrast  03/25/2013   *RADIOLOGY REPORT*  Clinical Data:  Status post fall with head injury.  Altered mental status.  CT HEAD WITHOUT CONTRAST CT CERVICAL SPINE WITHOUT CONTRAST  Technique:  Multidetector CT imaging of the head and cervical spine was performed following the standard protocol without intravenous contrast.  Multiplanar CT image reconstructions of the cervical spine were also generated.  Comparison:  Head CT 02/02/2013.  CT HEAD  Findings: The patient's head is tilted within the CT gantry. Chronic atrophy and a chronic left cerebellar infarct are stable. There is no evidence of acute intracranial hemorrhage, mass lesion, brain edema or extra-axial fluid collection.  There is no CT evidence of acute stroke.  The visualized paranasal sinuses, mastoid air cells and middle ears are clear.  The calvarium is intact.  IMPRESSION: Stable head CT.  No acute intracranial or calvarial findings.  CT CERVICAL SPINE  Findings: There is no evidence of acute cervical spine fracture or traumatic subluxation.  There is straightening of the usual cervical lordosis with a grade 1 degenerative anterolisthesis at C3- C4 and C4-C5.  The facet  joints appear fused on the right at C3-C5 and on the left at C3-C4.  There is multilevel disc space loss with posterior osteophytes, greatest at C5-C6 and C6-C7.  No acute soft tissue abnormalities are identified.  Carotid artery calcifications and mild biapical pulmonary scarring are noted.  IMPRESSION:  1.  No evidence of acute cervical spine fracture, traumatic subluxation or static signs instability. 2.  Moderate multilevel spondylosis as described.   Original Report Authenticated By: Carey Bullocks, M.D.   Dg Chest Portable 1 View  03/25/2013   *RADIOLOGY REPORT*  Clinical Data: Trauma from a fall.  PORTABLE CHEST - 1 VIEW  Comparison: Chest x-ray 08/16/2011.  Findings: Focal area of scarring in the left mid lung is unchanged. Mild diffuse peribronchial cuffing appears to be chronic. Lung volumes are normal.  No consolidative airspace disease.  No pleural effusions.  No pneumothorax.  No pulmonary nodule or mass noted. Pulmonary vasculature and the cardiomediastinal silhouette are within normal limits.  Atherosclerosis of the thoracic aorta. Surgical clips overlying the left breast.  IMPRESSION: 1. No radiographic evidence of acute cardiopulmonary disease. 2.  Atherosclerosis. 3.  Chronic scarring in the left midlung is unchanged. 4.  Mild diffuse peribronchial cuffing, similar to prior examinations, presumably related to chronic bronchitis.   Original Report Authenticated By: Trudie Reed, M.D.   Labs Reviewed - No data to display No results found. No diagnosis found. 1. Fall 2. Atraumatic physical exam MDM  Patient with history of multiple falls, resident of memory care. Negative physical exam today for injury. Collar removed. Patient seen by Dr. Silverio Lay and thought stable for discharge.   Arnoldo Hooker, PA-C 03/29/13 1535

## 2013-03-29 NOTE — ED Notes (Signed)
Bed: WA03 Expected date:  Expected time:  Means of arrival: Ambulance Comments: Elderly/fall

## 2013-03-29 NOTE — ED Notes (Signed)
[facility] called  

## 2013-03-29 NOTE — ED Notes (Signed)
Pt to xray on stretcher

## 2013-03-29 NOTE — ED Notes (Signed)
Family at bedside.  Pt resting.  Appears to be free of pain. Awaiting eval.

## 2013-03-29 NOTE — ED Notes (Addendum)
Pt from memory unit at [facility].  Has has had approx 4 falls in the last 2 weeks. Today she was walking and tripped falling into a chair, hitting her head on the chair (unspecified part of the chair).  No injury noted to her head per EMS.  She was lying on the ground when EMS arrived.  Initially she had denied pain but w/palpation she c/o pain to thoracic spine and neck but no deformities were noted by EMS.   No LOC was reported.  Skin tear to RT knee and RT elbow.  She is a poor historian.  She was placed on KED.  She is a DNR

## 2013-04-01 NOTE — ED Provider Notes (Signed)
Medical screening examination/treatment/procedure(s) were conducted as a shared visit with non-physician practitioner(s) and myself.  I personally evaluated the patient during the encounter  [name] Briggs is a 79 y.o. female hx of dementia in the memory care unit here with fall. She had recurrent falls and was admitted for syncope last week and didn't find anything. Larey Seat again today. C collar placed by EMS. No signs of scalp hematoma and she is not on anticoagulants and CT head 4 days ago showed no bleed. Got xray cervical spine that showed no fracture. I discussed with family regarding goals of care and they want her to go back to memory care unit. Stable for d/c.   Level V caveat- dementia    Richardean Canal, MD 04/01/13 1040

## 2013-06-25 ENCOUNTER — Emergency Department (HOSPITAL_COMMUNITY)
Admission: EM | Admit: 2013-06-25 | Discharge: 2013-06-26 | Disposition: A | Discharge status: Skilled Nursing Facility | Payer: Medicare Other | Attending: Emergency Medicine | Admitting: Emergency Medicine

## 2013-06-25 ENCOUNTER — Encounter (HOSPITAL_COMMUNITY): Payer: Self-pay | Admitting: Emergency Medicine

## 2013-06-25 ENCOUNTER — Emergency Department (HOSPITAL_COMMUNITY): Payer: Medicare Other

## 2013-06-25 DIAGNOSIS — W19XXXA Unspecified fall, initial encounter: Secondary | ICD-10-CM

## 2013-06-25 DIAGNOSIS — Y9210 Unspecified residential institution as the place of occurrence of the external cause: Secondary | ICD-10-CM | POA: Insufficient documentation

## 2013-06-25 DIAGNOSIS — F0280 Dementia in other diseases classified elsewhere without behavioral disturbance: Secondary | ICD-10-CM | POA: Insufficient documentation

## 2013-06-25 DIAGNOSIS — Z7982 Long term (current) use of aspirin: Secondary | ICD-10-CM | POA: Insufficient documentation

## 2013-06-25 DIAGNOSIS — F411 Generalized anxiety disorder: Secondary | ICD-10-CM | POA: Insufficient documentation

## 2013-06-25 DIAGNOSIS — G309 Alzheimer's disease, unspecified: Secondary | ICD-10-CM | POA: Insufficient documentation

## 2013-06-25 DIAGNOSIS — F3289 Other specified depressive episodes: Secondary | ICD-10-CM | POA: Insufficient documentation

## 2013-06-25 DIAGNOSIS — Z853 Personal history of malignant neoplasm of breast: Secondary | ICD-10-CM | POA: Insufficient documentation

## 2013-06-25 DIAGNOSIS — Y9389 Activity, other specified: Secondary | ICD-10-CM | POA: Insufficient documentation

## 2013-06-25 DIAGNOSIS — W1809XA Striking against other object with subsequent fall, initial encounter: Secondary | ICD-10-CM | POA: Insufficient documentation

## 2013-06-25 DIAGNOSIS — Z79899 Other long term (current) drug therapy: Secondary | ICD-10-CM | POA: Insufficient documentation

## 2013-06-25 DIAGNOSIS — F329 Major depressive disorder, single episode, unspecified: Secondary | ICD-10-CM | POA: Insufficient documentation

## 2013-06-25 DIAGNOSIS — Z87891 Personal history of nicotine dependence: Secondary | ICD-10-CM | POA: Insufficient documentation

## 2013-06-25 DIAGNOSIS — S0003XA Contusion of scalp, initial encounter: Secondary | ICD-10-CM | POA: Insufficient documentation

## 2013-06-25 DIAGNOSIS — E039 Hypothyroidism, unspecified: Secondary | ICD-10-CM | POA: Insufficient documentation

## 2013-06-25 DIAGNOSIS — R21 Rash and other nonspecific skin eruption: Secondary | ICD-10-CM | POA: Insufficient documentation

## 2013-06-25 DIAGNOSIS — I10 Essential (primary) hypertension: Secondary | ICD-10-CM | POA: Insufficient documentation

## 2013-06-25 DIAGNOSIS — Z87440 Personal history of urinary (tract) infections: Secondary | ICD-10-CM | POA: Insufficient documentation

## 2013-06-25 LAB — URINALYSIS, ROUTINE W REFLEX MICROSCOPIC
Bilirubin Urine: NEGATIVE
Nitrite: NEGATIVE
Protein, ur: NEGATIVE mg/dL
Specific Gravity, Urine: 1.022 (ref 1.005–1.030)
Urobilinogen, UA: 0.2 mg/dL (ref 0.0–1.0)

## 2013-06-25 NOTE — ED Provider Notes (Signed)
CSN: [id number]     Arrival date & time 06/25/13  2034 History   First MD Initiated Contact with Patient 06/25/13 2035     Chief Complaint  Patient presents with  . Fall   HPI  Patient presents via EMS from nursing facility after an unwitnessed fall. Patient has dementia, level V caveat. Per report the patient was heard falling onto the floor.  Staff found the patient lying on the floor, with no active complaints. There is no active bleeding. No reported loss of consciousness.   Past Medical History  Diagnosis Date  . Hypertension   . Alzheimer disease   . Depression   . Breast cancer   . Sleep apnea   . Hypothyroidism   . Anxiety   . Syncope and collapse   . Sinus bradycardia   . UTI (urinary tract infection)     ACUTE  . Dehydration     ACUTE  . Bradycardia    Past Surgical History  Procedure Laterality Date  . Cholecystectomy    . Breast surgery      bilteral mastectomy's   Family History  Problem Relation Age of Onset  . Heart failure Mother   . Parkinsonism Father    History  Substance Use Topics  . Smoking status: Former Smoker    Quit date: 12/21/1987  . Smokeless tobacco: Never Used  . Alcohol Use: No   OB History   Grav Para Term Preterm Abortions TAB SAB Ect Mult Living                 Review of Systems  Unable to perform ROS: Dementia  Skin: Positive for rash.    Allergies  Ace inhibitors; Bystolic; and Ciprofloxacin  Home Medications   Current Outpatient Rx  Name  Route  Sig  Dispense  Refill  . aspirin EC 81 MG tablet   Oral   Take 81 mg by mouth daily.         . Calcium Carbonate-Vitamin D (CALCIUM + D PO)   Oral   Take 600 mg by mouth 2 (two) times daily.           . Dermatological Products, [place]. (NUVAIL) SOLN   Topical   Apply 1 application topically at bedtime. Applied to nails on both feet.         . divalproex (DEPAKOTE SPRINKLE) 125 MG capsule   Oral   Take 125 mg by mouth 3 (three) times daily.         .  feeding supplement (ENSURE IMMUNE HEALTH) LIQD   Oral   Take 237 mLs by mouth daily with supper. VANILLA         . ibuprofen (ADVIL,MOTRIN) 600 MG tablet   Oral   Take 600 mg by mouth every 8 (eight) hours as needed for pain.         Marland Kitchen levothyroxine (SYNTHROID, LEVOTHROID) 100 MCG tablet   Oral   Take 100 mcg by mouth daily.          Marland Kitchen LORazepam (ATIVAN) 0.5 MG tablet   Oral   Take 1 tablet (0.5 mg total) by mouth every 6 (six) hours as needed for anxiety (or agitation).   120 tablet   5   . losartan (COZAAR) 25 MG tablet   Oral   Take 25 mg by mouth 2 (two) times daily.           . polyethylene glycol (MIRALAX / GLYCOLAX) packet   Oral  Take 17 g by mouth daily as needed (for constipation).         . rivastigmine (EXELON) 9.5 mg/24hr   Transdermal   Place 1 patch onto the skin daily.         Marland Kitchen senna-docusate (SENOKOT-S) 8.6-50 MG per tablet   Oral   Take 2 tablets by mouth daily.         . ursodiol (ACTIGALL) 300 MG capsule   Oral   Take 300 mg by mouth 3 (three) times daily.          Marland Kitchen venlafaxine XR (EFFEXOR-XR) 75 MG 24 hr capsule   Oral   Take 75 mg by mouth every morning.         . vitamin B-12 (CYANOCOBALAMIN) 1000 MCG tablet   Oral   Take 1,000 mcg by mouth 2 (two) times daily.            BP 153/77  Pulse 65  Temp(Src) 98.2 F (36.8 C) (Oral)  Resp 18  SpO2 100% Physical Exam  Nursing note and vitals reviewed. Constitutional: She is oriented to person, place, and time. She appears well-developed and well-nourished. No distress. Cervical collar and backboard in place.    HENT:  Head: Normocephalic and atraumatic.  Eyes: Conjunctivae and EOM are normal.  Cardiovascular: Normal rate and regular rhythm.   Pulmonary/Chest: Effort normal and breath sounds normal. No stridor. No respiratory distress.  Abdominal: She exhibits no distension.  Musculoskeletal: She exhibits no edema.  Neurological: She is alert and oriented to person,  place, and time. She displays atrophy. She displays no tremor. No cranial nerve deficit or sensory deficit. She exhibits normal muscle tone. She displays no seizure activity.  Does not cooperate with exam fully - but MAES, and follows some commands  Skin: Skin is warm and dry.  No gross lesions, the patient is noted to have scabies  Psychiatric: Her speech is delayed. She is slowed and withdrawn. Cognition and memory are impaired.    ED Course  Procedures (including critical care time) Labs Review Labs Reviewed  URINALYSIS, ROUTINE W REFLEX MICROSCOPIC   Imaging Review No results found.  EKG Interpretation   None      Update: On repeat exam the patient is in no distress.  She remains hemodynamically stable  MDM  No diagnosis found. This patient presents from her nursing facility after an unwitnessed fall.  With the patient's history of dementia, the hematoma, she had  Extremities performed.  These were reassuring.  Patient was discharged in stable condition back to her monitor facility    Gerhard Munch, MD 06/25/13 [phone]

## 2013-06-25 NOTE — ED Notes (Signed)
Patient transported to CT 

## 2013-06-25 NOTE — ED Notes (Signed)
[facility] called for transport.  

## 2013-06-25 NOTE — ED Notes (Signed)
Per EMS report: pt from [name]; Pt had an un witnessed fall but staff heard the fall.  Fall occurred approx 19:30 and pt was found by a door and believed to have hit her head on the door handle.  Pt a/o x 1 which is her baseline d/t to pt's dementia.  Pt initially reported pain in her back but on arrival to ED, pt reports pain to her hip.  Hematoma palpated left back side of her head. Nursing home states pt currently has scabies. Pt has a DNR.

## 2013-06-25 NOTE — ED Notes (Signed)
Bed: YQ65 Expected date:  Expected time:  Means of arrival:  Comments: EMS 79yo F, fall

## 2014-01-14 ENCOUNTER — Emergency Department (HOSPITAL_COMMUNITY): Payer: Medicare Other

## 2014-01-14 ENCOUNTER — Emergency Department (HOSPITAL_COMMUNITY)
Admission: EM | Admit: 2014-01-14 | Discharge: 2014-01-14 | Disposition: A | Discharge status: Home / Self Care | Payer: Medicare Other | Attending: Emergency Medicine | Admitting: Emergency Medicine

## 2014-01-14 ENCOUNTER — Encounter (HOSPITAL_COMMUNITY): Payer: Self-pay | Admitting: Emergency Medicine

## 2014-01-14 DIAGNOSIS — E039 Hypothyroidism, unspecified: Secondary | ICD-10-CM | POA: Diagnosis not present

## 2014-01-14 DIAGNOSIS — Z7982 Long term (current) use of aspirin: Secondary | ICD-10-CM | POA: Diagnosis not present

## 2014-01-14 DIAGNOSIS — G309 Alzheimer's disease, unspecified: Secondary | ICD-10-CM | POA: Diagnosis not present

## 2014-01-14 DIAGNOSIS — F411 Generalized anxiety disorder: Secondary | ICD-10-CM | POA: Insufficient documentation

## 2014-01-14 DIAGNOSIS — F329 Major depressive disorder, single episode, unspecified: Secondary | ICD-10-CM | POA: Diagnosis not present

## 2014-01-14 DIAGNOSIS — F02818 Dementia in other diseases classified elsewhere, unspecified severity, with other behavioral disturbance: Secondary | ICD-10-CM | POA: Diagnosis not present

## 2014-01-14 DIAGNOSIS — Z87440 Personal history of urinary (tract) infections: Secondary | ICD-10-CM | POA: Diagnosis not present

## 2014-01-14 DIAGNOSIS — I10 Essential (primary) hypertension: Secondary | ICD-10-CM | POA: Diagnosis not present

## 2014-01-14 DIAGNOSIS — F0281 Dementia in other diseases classified elsewhere with behavioral disturbance: Secondary | ICD-10-CM | POA: Diagnosis not present

## 2014-01-14 DIAGNOSIS — Z87891 Personal history of nicotine dependence: Secondary | ICD-10-CM | POA: Diagnosis not present

## 2014-01-14 DIAGNOSIS — Z008 Encounter for other general examination: Secondary | ICD-10-CM | POA: Diagnosis present

## 2014-01-14 DIAGNOSIS — F3289 Other specified depressive episodes: Secondary | ICD-10-CM | POA: Diagnosis not present

## 2014-01-14 DIAGNOSIS — F0280 Dementia in other diseases classified elsewhere without behavioral disturbance: Secondary | ICD-10-CM | POA: Insufficient documentation

## 2014-01-14 DIAGNOSIS — Z853 Personal history of malignant neoplasm of breast: Secondary | ICD-10-CM | POA: Diagnosis not present

## 2014-01-14 LAB — BASIC METABOLIC PANEL
BUN: 22 mg/dL (ref 6–23)
CHLORIDE: 101 meq/L (ref 96–112)
CO2: 31 mEq/L (ref 19–32)
Calcium: 9.5 mg/dL (ref 8.4–10.5)
Creatinine, Ser: 0.8 mg/dL (ref 0.50–1.10)
GFR calc Af Amer: 76 mL/min — ABNORMAL LOW (ref >90)
GFR calc non Af Amer: 66 mL/min — ABNORMAL LOW (ref >90)
Glucose, Bld: 88 mg/dL (ref 70–99)
Potassium: 4.2 mEq/L (ref 3.7–5.3)
Sodium: 140 mEq/L (ref 137–147)

## 2014-01-14 LAB — URINALYSIS, ROUTINE W REFLEX MICROSCOPIC
BILIRUBIN URINE: NEGATIVE
Glucose, UA: NEGATIVE mg/dL
HGB URINE DIPSTICK: NEGATIVE
KETONES UR: NEGATIVE mg/dL
Leukocytes, UA: NEGATIVE
Nitrite: NEGATIVE
Protein, ur: NEGATIVE mg/dL
Specific Gravity, Urine: 1.007 (ref 1.005–1.030)
UROBILINOGEN UA: 0.2 mg/dL (ref 0.0–1.0)
pH: 6.5 (ref 5.0–8.0)

## 2014-01-14 LAB — CBC WITH DIFFERENTIAL/PLATELET
Basophils Absolute: 0 10*3/uL (ref 0.0–0.1)
Basophils Relative: 1 % (ref 0–1)
EOS PCT: 3 % (ref 0–5)
Eosinophils Absolute: 0.2 10*3/uL (ref 0.0–0.7)
HEMATOCRIT: 35.3 % — AB (ref 36.0–46.0)
HEMOGLOBIN: 11.6 g/dL — AB (ref 12.0–15.0)
LYMPHS PCT: 31 % (ref 12–46)
Lymphs Abs: 1.4 10*3/uL (ref 0.7–4.0)
MCH: 30.5 pg (ref 26.0–34.0)
MCHC: 32.9 g/dL (ref 30.0–36.0)
MCV: 92.9 fL (ref 78.0–100.0)
MONO ABS: 0.3 10*3/uL (ref 0.1–1.0)
MONOS PCT: 7 % (ref 3–12)
NEUTROS ABS: 2.6 10*3/uL (ref 1.7–7.7)
Neutrophils Relative %: 57 % (ref 43–77)
Platelets: 140 10*3/uL — ABNORMAL LOW (ref 150–400)
RBC: 3.8 MIL/uL — ABNORMAL LOW (ref 3.87–5.11)
RDW: 13.7 % (ref 11.5–15.5)
WBC: 4.5 10*3/uL (ref 4.0–10.5)

## 2014-01-14 LAB — HEPATIC FUNCTION PANEL
ALT: 14 U/L (ref 0–35)
AST: 23 U/L (ref 0–37)
Albumin: 3.4 g/dL — ABNORMAL LOW (ref 3.5–5.2)
Alkaline Phosphatase: 55 U/L (ref 39–117)
Total Bilirubin: 0.2 mg/dL — ABNORMAL LOW (ref 0.3–1.2)
Total Protein: 7.2 g/dL (ref 6.0–8.3)

## 2014-01-14 LAB — PROTIME-INR
INR: 1 (ref 0.00–1.49)
Prothrombin Time: 13 seconds (ref 11.6–15.2)

## 2014-01-14 LAB — VALPROIC ACID LEVEL: Valproic Acid Lvl: 24.4 ug/mL — ABNORMAL LOW (ref 50.0–100.0)

## 2014-01-14 LAB — TSH: TSH: 5.58 u[IU]/mL — ABNORMAL HIGH (ref 0.350–4.500)

## 2014-01-14 MED ORDER — DIVALPROEX SODIUM 125 MG PO DR TAB: 125.0000 mg | DELAYED_RELEASE_TABLET | Freq: Four times a day (QID) | ORAL | Status: DC | PRN

## 2014-01-14 NOTE — Discharge Instructions (Signed)
Please follow with your primary care doctor in the next 2 days for a check-up. They must obtain records for further management.   Do not hesitate to return to the Emergency Department for any new, worsening or concerning symptoms.    Alzheimer's Disease Caregiver Guide Alzheimer's disease is an illness that affects a person's brain. It causes a person to lose the ability to remember things and make good decisions. As the disease progresses, the person is unable to take care of himself or herself and needs more and more help to do simple tasks. Taking care of someone with Alzheimer's disease can be very challenging and overwhelming.  MEMORY LOSS AND CONFUSION Memory loss and confusion is mild in the beginning stages of the disease. Both of these problems become more severe as the disease progresses. Eventually, the person will not recognize places or even close family members and friends.   Stay calm.  Respond with a short explanation. Long explanations can be overwhelming and confusing.  Avoid corrections that sound like scolding.  Try not to take it personally, even if the person forgets your name. BEHAVIOR CHANGES Behavior changes are part of the disease. The person may develop depression, anxiety, anger, hallucinations, or other behavior changes. These changes can come on suddenly and may be in response to pain, infection, changes in the environment (temperature, noise), overstimulation, or feeling lost or scared.   Try not to take behavior changes personally.  Remain calm and patient.  Do not argue or try to convince the person about a specific point. This will only make him or her more agitated.  Know that the behavior changes are part of the disease process and try to work through it. TIPS TO REDUCE FRUSTRATION  Schedule wisely by making appointments and doing daily tasks, like bathing and dressing, when the person is at his or her best.  Take your time. Simple tasks may take a  lot longer, so be sure to allow for plenty of time.  Limit choices. Too many choices can be overwhelming and stressful for the person.  Involve the person in what you are doing.  Stick to a routine.  Avoid new or crowded situations, if possible.  Use simple words, short sentences, and a calm voice. Only give 1 direction at a time.  Buy clothes and shoes that are easy to put on and take off.  Let people help if they offer. HOME SAFETY Keeping the home safe is very important to reduce the risk of falls and injuries.   Keep floors clear of clutter. Remove rugs, magazine racks, and floor lamps.  Keep hallways well lit.  Put a handrail and nonslip mat in the bathtub or shower.  Put childproof locks on cabinets with dangerous items, such as medicine, alcohol, guns, toxic cleaning items, sharp tools or utensils, matches, or lighters.  Place locks on doors where the person cannot easily see or reach them. This helps ensure that the person cannot wander out of the house and get lost.  Be prepared for emergencies. Keep a list of emergency phone numbers and addresses in a convenient area. PLANS FOR THE FUTURE  Do not put off talking about finances.  Talk about money management. People with Alzheimer's disease have trouble managing their money as the disease gets worse.  Get help from professional advisors regarding financial and legal matters.  Do not put off talking about future care.  Choose a power of attorney. This is someone who can make decisions for the  person with Alzheimer's disease when he or she is no longer able to do so.  Talk about driving and when it is the right time to stop. The person's doctor can help give advice on this matter.  Talk about the person's living situation. If he or she lives alone, you need to make sure he or she is safe. Some people need extra help at home, and others need more care at a nursing home or care center. SUPPORT GROUPS Joining a support  group can be very helpful for caregivers of people with Alzheimer's disease. Some advantages to being part of a support group include:   Getting strategies to manage stress.  Sharing experiences with others.  Receiving emotional comfort and support.  Learning new caregiving skills as the disease progresses.  Knowing what community resources are available and taking advantage of them. SEEK MEDICAL CARE IF:  The person has a fever.  The person has a sudden change in behavior that does not improve with calming strategies.  The person is unable to manage in his or her current living situation.  The person threatens you or anyone else, including himself or herself.  You are no longer able to care for the person. Document Released: 04/10/2004 Document Revised: 01/29/2012 Document Reviewed: 09/05/2011 [facility] Patient Information 2014 [place].

## 2014-01-14 NOTE — ED Notes (Signed)
[facility] called for transport.  

## 2014-01-14 NOTE — ED Notes (Signed)
Social worker working with patient came to the hospital to assess the situation and understand what was going on. Explained to family and social worker that pt will be returning to the facility as she has been medically cleared and there is no reason to admit the pt here or to another facility. Called facility to let them know that pt would be returning and they initially refused and said that they would not accept this pt back into the facility. This RN spoke with our Education officer, museum here at [facility] as well as the physician and PA and in further conversation it was explained that legally the facility cannot refuse to take the patient back. [facility] called for transport. Facility, Education officer, museum, and family and understand at this time that pt will be returning to the facility.
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
## 2014-01-14 NOTE — ED Notes (Signed)
After cleaning pt with betadine and before placing in and out cath in patient, pt started to urinate, clean catch urine sample caught in urine cup.

## 2014-01-14 NOTE — ED Notes (Signed)
Corrected HR of 56, not 166.

## 2014-01-14 NOTE — Progress Notes (Signed)
CSW met with patient and family at bedside. Patient has a diagnosis of dementia and is currently only oriented to self.  Patient's son/POA and patient's sister was at bedside.  The facility social worker was also at bedside and they developed a plan for the son or a family member to stay at the facility with the patient until she can be seen by the physician in the morning.  The son has agreed with this plan and the patient will be return to the facility by EMS.       , MSW, LCSWA, 01/14/2014 Evening Clinical Social Worker [phone] 

## 2014-01-15 NOTE — ED Provider Notes (Signed)
Medical screening examination/treatment/procedure(s) were conducted as a shared visit with non-physician practitioner(s) and myself.  I personally evaluated the patient during the encounter.   EKG Interpretation   Date/Time:  Thursday January 14 2014 17:34:54 EDT Ventricular Rate:  102 PR Interval:  53 QRS Duration: 146 QT Interval:  487 QTC Calculation: 634 R Axis:   -69 Text Interpretation:  Sinus node reentrant tachycardia artifact mildly  limiting interpretation Confirmed by Ticia Virgo  MD, Selz ([id number]) on  01/14/2014 6:00:45 PM      I examined the patient. Lungs are CTAB. Cardiac exam wnl. Abdomen soft.  Pt interactive, pleasant, does not follow commands well. Initial HR value was spurious, I reviewed the ecg which showed baseline artifact likely d/t movement/tremor. No underlying infection or medical need for admission. Will plan on d/c back to facility.   Blanchard Kelch, MD 01/15/14 [phone]

## 2014-01-16 LAB — URINE CULTURE
COLONY COUNT: NO GROWTH
Culture: NO GROWTH

## 2014-03-23 ENCOUNTER — Emergency Department (HOSPITAL_BASED_OUTPATIENT_CLINIC_OR_DEPARTMENT_OTHER): Payer: Medicare Other

## 2014-03-23 ENCOUNTER — Emergency Department (HOSPITAL_BASED_OUTPATIENT_CLINIC_OR_DEPARTMENT_OTHER)
Admission: EM | Admit: 2014-03-23 | Discharge: 2014-03-23 | Disposition: A | Discharge status: Home / Self Care | Payer: Medicare Other | Attending: Emergency Medicine | Admitting: Emergency Medicine

## 2014-03-23 ENCOUNTER — Encounter (HOSPITAL_BASED_OUTPATIENT_CLINIC_OR_DEPARTMENT_OTHER): Payer: Self-pay | Admitting: Emergency Medicine

## 2014-03-23 DIAGNOSIS — Y9389 Activity, other specified: Secondary | ICD-10-CM | POA: Diagnosis not present

## 2014-03-23 DIAGNOSIS — Z853 Personal history of malignant neoplasm of breast: Secondary | ICD-10-CM | POA: Diagnosis not present

## 2014-03-23 DIAGNOSIS — Z9181 History of falling: Secondary | ICD-10-CM | POA: Insufficient documentation

## 2014-03-23 DIAGNOSIS — R296 Repeated falls: Secondary | ICD-10-CM | POA: Insufficient documentation

## 2014-03-23 DIAGNOSIS — F411 Generalized anxiety disorder: Secondary | ICD-10-CM | POA: Diagnosis not present

## 2014-03-23 DIAGNOSIS — F3289 Other specified depressive episodes: Secondary | ICD-10-CM | POA: Diagnosis not present

## 2014-03-23 DIAGNOSIS — Z8679 Personal history of other diseases of the circulatory system: Secondary | ICD-10-CM | POA: Insufficient documentation

## 2014-03-23 DIAGNOSIS — Z87891 Personal history of nicotine dependence: Secondary | ICD-10-CM | POA: Insufficient documentation

## 2014-03-23 DIAGNOSIS — Y9210 Unspecified residential institution as the place of occurrence of the external cause: Secondary | ICD-10-CM | POA: Diagnosis not present

## 2014-03-23 DIAGNOSIS — G309 Alzheimer's disease, unspecified: Secondary | ICD-10-CM | POA: Insufficient documentation

## 2014-03-23 DIAGNOSIS — F329 Major depressive disorder, single episode, unspecified: Secondary | ICD-10-CM | POA: Diagnosis not present

## 2014-03-23 DIAGNOSIS — Z79899 Other long term (current) drug therapy: Secondary | ICD-10-CM | POA: Insufficient documentation

## 2014-03-23 DIAGNOSIS — Z87442 Personal history of urinary calculi: Secondary | ICD-10-CM | POA: Diagnosis not present

## 2014-03-23 DIAGNOSIS — S59909A Unspecified injury of unspecified elbow, initial encounter: Secondary | ICD-10-CM | POA: Diagnosis present

## 2014-03-23 DIAGNOSIS — S6990XA Unspecified injury of unspecified wrist, hand and finger(s), initial encounter: Secondary | ICD-10-CM | POA: Diagnosis present

## 2014-03-23 DIAGNOSIS — F0280 Dementia in other diseases classified elsewhere without behavioral disturbance: Secondary | ICD-10-CM | POA: Diagnosis not present

## 2014-03-23 DIAGNOSIS — Z7982 Long term (current) use of aspirin: Secondary | ICD-10-CM | POA: Diagnosis not present

## 2014-03-23 DIAGNOSIS — I10 Essential (primary) hypertension: Secondary | ICD-10-CM | POA: Insufficient documentation

## 2014-03-23 DIAGNOSIS — S41109A Unspecified open wound of unspecified upper arm, initial encounter: Secondary | ICD-10-CM | POA: Diagnosis not present

## 2014-03-23 DIAGNOSIS — S59919A Unspecified injury of unspecified forearm, initial encounter: Secondary | ICD-10-CM

## 2014-03-23 DIAGNOSIS — E039 Hypothyroidism, unspecified: Secondary | ICD-10-CM | POA: Diagnosis not present

## 2014-03-23 NOTE — Discharge Instructions (Signed)
Fall Prevention and Home Safety °Falls cause injuries and can affect all age groups. It is possible to prevent falls.  °HOW TO PREVENT FALLS °· Wear shoes with rubber soles that do not have an opening for your toes. °· Keep the inside and outside of your house well lit. °· Use night lights throughout your home. °· Remove clutter from floors. °· Clean up floor spills. °· Remove throw rugs or fasten them to the floor with carpet tape. °· Do not place electrical cords across pathways. °· Put grab bars by your tub, shower, and toilet. Do not use towel bars as grab bars. °· Put handrails on both sides of the stairway. Fix loose handrails. °· Do not climb on stools or stepladders, if possible. °· Do not wax your floors. °· Repair uneven or unsafe sidewalks, walkways, or stairs. °· Keep items you use a lot within reach. °· Be aware of pets. °· Keep emergency numbers next to the telephone. °· Put smoke detectors in your home and near bedrooms. °Ask your doctor what other things you can do to prevent falls. °Document Released: 05/26/2009 Document Revised: 01/29/2012 Document Reviewed: 10/30/2011 °[employer]® Patient Information ©2015 [employer]. This information is not intended to replace advice given to you by your health care provider. Make sure you discuss any questions you have with your health care provider. ° °

## 2014-03-23 NOTE — ED Provider Notes (Signed)
CSN: [id number]     Arrival date & time 03/23/14  1118 History   First MD Initiated Contact with Patient 03/23/14 1131     Chief Complaint  Patient presents with  . Fall     (Consider location/radiation/quality/duration/timing/severity/associated sxs/prior Treatment) HPI Comments: Patient sent to the emergency department from skilled nursing facility after unwitnessed fall. Patient was found lying on her back in the dining hall. She was sent to the ER for further evaluation. She has baseline dementia, cannot answer any further questions. She does not take any anticoagulation. EMS report that she seemed to be favoring her left shoulder initially. Level V Caveat due to dementia.  Patient is a 80 y.o. female presenting with fall.  Fall    Past Medical History  Diagnosis Date  . Hypertension   . Alzheimer disease   . Depression   . Breast cancer   . Sleep apnea   . Hypothyroidism   . Anxiety   . Syncope and collapse   . Sinus bradycardia   . UTI (urinary tract infection)     ACUTE  . Dehydration     ACUTE  . Bradycardia    Past Surgical History  Procedure Laterality Date  . Cholecystectomy    . Breast surgery      bilteral mastectomy's   Family History  Problem Relation Age of Onset  . Heart failure Mother   . Parkinsonism Father    History  Substance Use Topics  . Smoking status: Former Smoker    Quit date: 12/21/1987  . Smokeless tobacco: Never Used  . Alcohol Use: No   OB History   Grav Para Term Preterm Abortions TAB SAB Ect Mult Living                 Review of Systems  Unable to perform ROS: Dementia      Allergies  Ace inhibitors; Bystolic; and Ciprofloxacin  Home Medications   Prior to Admission medications   Medication Sig Start Date End Date Taking? Authorizing Provider  aspirin EC 81 MG tablet Take 81 mg by mouth daily.    Historical Provider, MD  Calcium Carbonate-Vitamin D (CALCIUM + D PO) Take 600 mg by mouth 2 (two) times daily.       Historical Provider, MD  Dermatological Products, [place]. (NUVAIL) SOLN Apply 1 application topically at bedtime. Applied to nails on both feet.    Historical Provider, MD  divalproex (DEPAKOTE) 125 MG DR tablet Take 1 tablet (125 mg total) by mouth 4 (four) times daily as needed. 01/14/14   Nicole Pisciotta, PA-C  feeding supplement (ENSURE IMMUNE HEALTH) LIQD Take 237 mLs by mouth daily with supper. VANILLA    Historical Provider, MD  hydrochlorothiazide (HYDRODIURIL) 25 MG tablet Take 25 mg by mouth daily.    Historical Provider, MD  levothyroxine (SYNTHROID, LEVOTHROID) 100 MCG tablet Take 100 mcg by mouth daily.     Historical Provider, MD  LORazepam (ATIVAN) 0.5 MG tablet Take 0.5 mg by mouth at bedtime. 03/13/13   Blanchie Serve, MD  losartan (COZAAR) 25 MG tablet Take 25 mg by mouth 2 (two) times daily.      Historical Provider, MD  polyethylene glycol (MIRALAX / GLYCOLAX) packet Take 17 g by mouth daily as needed (for constipation).    Historical Provider, MD  senna-docusate (SENOKOT-S) 8.6-50 MG per tablet Take 2 tablets by mouth daily.    Historical Provider, MD  ursodiol (ACTIGALL) 300 MG capsule Take 300 mg by mouth 3 (three)  times daily.     Historical Provider, MD  venlafaxine (EFFEXOR) 25 MG tablet Take 25 mg by mouth at bedtime.    Historical Provider, MD  vitamin B-12 (CYANOCOBALAMIN) 1000 MCG tablet Take 1,000 mcg by mouth daily.     Historical Provider, MD   BP 134/70  Pulse 61  Temp(Src) 97.6 F (36.4 C) (Axillary)  Resp 20  Wt 120 lb (54.432 kg)  SpO2 100% Physical Exam  Constitutional: She is oriented to person, place, and time. She appears well-developed and well-nourished. No distress.  HENT:  Head: Normocephalic and atraumatic.  Right Ear: Hearing normal.  Left Ear: Hearing normal.  Nose: Nose normal.  Mouth/Throat: Oropharynx is clear and moist and mucous membranes are normal.  Eyes: Conjunctivae and EOM are normal. Pupils are equal, round, and reactive to light.    Neck: Normal range of motion. Neck supple.  Cardiovascular: Regular rhythm, S1 normal and S2 normal.  Exam reveals no gallop and no friction rub.   No murmur heard. Pulmonary/Chest: Effort normal and breath sounds normal. No respiratory distress. She exhibits no tenderness.  Abdominal: Soft. Normal appearance and bowel sounds are normal. There is no hepatosplenomegaly. There is no tenderness. There is no rebound, no guarding, no tenderness at McBurney's point and negative Murphy's sign. No hernia.  Musculoskeletal: Normal range of motion.  Neurological: She is alert and oriented to person, place, and time. She has normal strength. No cranial nerve deficit or sensory deficit. Coordination normal. GCS eye subscore is 4. GCS verbal subscore is 5. GCS motor subscore is 6.  Skin: Skin is warm, dry and intact. No rash noted. No cyanosis.     Psychiatric: She has a normal mood and affect. Her speech is normal and behavior is normal. Thought content normal.    ED Course  Procedures (including critical care time) Labs Review Labs Reviewed - No data to display  Imaging Review Dg Chest 1 View  03/23/2014   CLINICAL DATA:  Unwitnessed fall.  EXAM: CHEST - 1 VIEW  COMPARISON:  01/14/2014  FINDINGS: Heart size and pulmonary vascularity are normal. There is scarring in the lateral aspect of the left midzone. Lungs are otherwise clear. Previous left breast surgery. No acute osseous abnormality.  IMPRESSION: No acute abnormality.   Electronically Signed   By: Rozetta Nunnery M.D.   On: 03/23/2014 12:26   Dg Thoracic Spine 2 View  03/23/2014   CLINICAL DATA:  Fall  EXAM: THORACIC SPINE - 2 VIEW  COMPARISON:  Prior radiograph from 01/14/2014  FINDINGS: Mild dextroscoliosis present. Vertebral bodies are otherwise normally aligned with preservation of the normal thoracic kyphosis. On lateral projection, there is question of anterior wedging deformity of the L1 vertebral body. This is not definitely seen on frontal  projection nor on the concomitant radiograph of the lumbar spine. Vertebral body heights are otherwise maintained. No acute fracture or listhesis.  Moderate multilevel degenerative disc disease present within the visualized spine.  Paraspinous soft tissues are unremarkable. Atherosclerotic calcifications noted within the aorta.  IMPRESSION: 1. Question anterior wedging deformity of the L1 vertebral body. This is age indeterminate. Attention on concomitant lumbar spine radiograph recommended. Correlation with physical exam for possible pain at this site also recommended. 2. No other acute traumatic injury within the thoracic spine.   Electronically Signed   By: Jeannine Boga M.D.   On: 03/23/2014 12:32   Dg Lumbar Spine Complete  03/23/2014   CLINICAL DATA:  Unwitnessed fall  EXAM: LUMBAR SPINE - COMPLETE  4+ VIEW  COMPARISON:  None.  FINDINGS: There are 5 nonrib bearing lumbar-type vertebral bodies. There is mild anterior vertebral body height loss of the L4 vertebral body which is new compared with 07/23/2011, but technically of indeterminate age. The alignment is anatomic. There is no spondylolysis. There is no acute fracture or static listhesis. Degenerative disc disease at L5-S1 with disc space narrowing and endplate osteophytosis.  The SI joints are unremarkable.  IMPRESSION: There is mild anterior vertebral body height loss of the L4 vertebral body which is new compared with 07/23/2011, but technically of indeterminate age.   Electronically Signed   By: Kathreen Devoid   On: 03/23/2014 12:27   Dg Pelvis 1-2 Views  03/23/2014   CLINICAL DATA:  Unwitnessed fall.  The patient was found down.  EXAM: PELVIS - 1-2 VIEW  COMPARISON:  10/17/2010  FINDINGS: No acute abnormalities. Old deformity of the left inferior pubic ramus, stable.  IMPRESSION: No significant abnormality.   Electronically Signed   By: Rozetta Nunnery M.D.   On: 03/23/2014 12:27   Dg Forearm Left  03/23/2014   CLINICAL DATA:  Unwitnessed  fall.  The patient was found down.  EXAM: LEFT FOREARM - 2 VIEW  COMPARISON:  Radiographs dated 02/02/2013  FINDINGS: There is no evidence of fracture or other focal bone lesions. Soft tissues are unremarkable.  IMPRESSION: Normal exam.   Electronically Signed   By: Rozetta Nunnery M.D.   On: 03/23/2014 12:29   Ct Head Wo Contrast  03/23/2014   CLINICAL DATA:  Unwitnessed fall  EXAM: CT HEAD WITHOUT CONTRAST  CT CERVICAL SPINE WITHOUT CONTRAST  TECHNIQUE: Multidetector CT imaging of the head and cervical spine was performed following the standard protocol without intravenous contrast. Multiplanar CT image reconstructions of the cervical spine were also generated.  COMPARISON:  None.  FINDINGS: CT HEAD FINDINGS  There is no evidence of mass effect, midline shift, or extra-axial fluid collections. There is no evidence of a space-occupying lesion or intracranial hemorrhage. There is no evidence of a cortical-based area of acute infarction. There is generalized cerebral atrophy. There is periventricular white matter low attenuation likely secondary to microangiopathy.  The ventricles and sulci are appropriate for the patient's age. The basal cisterns are patent.  Visualized portions of the orbits are unremarkable. The visualized portions of the paranasal sinuses and mastoid air cells are unremarkable.  The osseous structures are unremarkable.  CT CERVICAL SPINE FINDINGS  The alignment is anatomic. The vertebral body heights are maintained. There is loss of the normal cervical lordosis with reversal. There is no acute fracture. There is 3 mm of anterolisthesis of C3 on C4 secondary to severe bilateral facet arthropathy. The prevertebral soft tissues are normal. The intraspinal soft tissues are not fully imaged on this examination due to poor soft tissue contrast, but there is no gross soft tissue abnormality.  There is degenerative disc disease throughout the cervical spine most severe at C5-6 and C6-7. There is severe  bilateral facet arthropathy at C2-3 and C3-4. There is severe right facet arthropathy at C4-5. There is a broad-based disc osteophyte complex at C5-6 with bilateral uncovertebral degenerative changes resulting in bilateral foraminal encroachment. There is a broad-based disc osteophyte complex at C6-7 with bilateral uncovertebral degenerative changes resulting in bilateral foraminal encroachment. There is bilateral facet arthropathy C7-T1 and T1-2.  There is biapical scarring.There is bilateral carotid artery atherosclerosis.  IMPRESSION: 1. No acute intracranial pathology. 2. No acute osseous injury of the cervical spine. 3.  Cervical spine spondylosis as described above.   Electronically Signed   By: Kathreen Devoid   On: 03/23/2014 12:25   Ct Cervical Spine Wo Contrast  03/23/2014   CLINICAL DATA:  Unwitnessed fall  EXAM: CT HEAD WITHOUT CONTRAST  CT CERVICAL SPINE WITHOUT CONTRAST  TECHNIQUE: Multidetector CT imaging of the head and cervical spine was performed following the standard protocol without intravenous contrast. Multiplanar CT image reconstructions of the cervical spine were also generated.  COMPARISON:  None.  FINDINGS: CT HEAD FINDINGS  There is no evidence of mass effect, midline shift, or extra-axial fluid collections. There is no evidence of a space-occupying lesion or intracranial hemorrhage. There is no evidence of a cortical-based area of acute infarction. There is generalized cerebral atrophy. There is periventricular white matter low attenuation likely secondary to microangiopathy.  The ventricles and sulci are appropriate for the patient's age. The basal cisterns are patent.  Visualized portions of the orbits are unremarkable. The visualized portions of the paranasal sinuses and mastoid air cells are unremarkable.  The osseous structures are unremarkable.  CT CERVICAL SPINE FINDINGS  The alignment is anatomic. The vertebral body heights are maintained. There is loss of the normal cervical  lordosis with reversal. There is no acute fracture. There is 3 mm of anterolisthesis of C3 on C4 secondary to severe bilateral facet arthropathy. The prevertebral soft tissues are normal. The intraspinal soft tissues are not fully imaged on this examination due to poor soft tissue contrast, but there is no gross soft tissue abnormality.  There is degenerative disc disease throughout the cervical spine most severe at C5-6 and C6-7. There is severe bilateral facet arthropathy at C2-3 and C3-4. There is severe right facet arthropathy at C4-5. There is a broad-based disc osteophyte complex at C5-6 with bilateral uncovertebral degenerative changes resulting in bilateral foraminal encroachment. There is a broad-based disc osteophyte complex at C6-7 with bilateral uncovertebral degenerative changes resulting in bilateral foraminal encroachment. There is bilateral facet arthropathy C7-T1 and T1-2.  There is biapical scarring.There is bilateral carotid artery atherosclerosis.  IMPRESSION: 1. No acute intracranial pathology. 2. No acute osseous injury of the cervical spine. 3. Cervical spine spondylosis as described above.   Electronically Signed   By: Kathreen Devoid   On: 03/23/2014 12:25   Dg Humerus Left  03/23/2014   CLINICAL DATA:  Unwitnessed fall.  The patient was found down.  EXAM: LEFT HUMERUS - 2+ VIEW  COMPARISON:  None.  FINDINGS: There is no evidence of fracture or other focal bone lesions. Soft tissues are unremarkable.  IMPRESSION: Normal exam.   Electronically Signed   By: Rozetta Nunnery M.D.   On: 03/23/2014 12:28     EKG Interpretation None      MDM   Final diagnoses:  None   unwitnessed fall  No obvious injury or deformity on examination. I do not see any obvious signs of injury to left arm, but she does have a slight skin tear, x-rays obtained. Because of the patient's baseline dementia, the unwitnessed nature of the fall, routine scans and x-rays ordered. The only abnormality seen was mild  wedge deformity of L4 new since 2012, the patient does not appear to have any point tenderness over this region. Patient prepared for discharge back to the nursing home.    Orpah Greek, MD 03/23/14 [phone]

## 2014-03-23 NOTE — ED Notes (Signed)
Unwitnessed fall.  Unknown LOC.  Found lying on back in dining hall.  Possible left shoulder injury.

## 2014-03-23 NOTE — ED Notes (Signed)
[facility] called for transport to [facility]

## 2014-07-01 ENCOUNTER — Encounter (HOSPITAL_BASED_OUTPATIENT_CLINIC_OR_DEPARTMENT_OTHER): Payer: Self-pay | Admitting: *Deleted

## 2014-07-01 ENCOUNTER — Emergency Department (HOSPITAL_BASED_OUTPATIENT_CLINIC_OR_DEPARTMENT_OTHER): Payer: Medicare Other

## 2014-07-01 ENCOUNTER — Emergency Department (HOSPITAL_BASED_OUTPATIENT_CLINIC_OR_DEPARTMENT_OTHER)
Admission: EM | Admit: 2014-07-01 | Discharge: 2014-07-01 | Disposition: A | Discharge status: Home / Self Care | Payer: Medicare Other | Attending: Emergency Medicine | Admitting: Emergency Medicine

## 2014-07-01 DIAGNOSIS — Y92128 Other place in nursing home as the place of occurrence of the external cause: Secondary | ICD-10-CM | POA: Diagnosis not present

## 2014-07-01 DIAGNOSIS — Z79899 Other long term (current) drug therapy: Secondary | ICD-10-CM | POA: Diagnosis not present

## 2014-07-01 DIAGNOSIS — F0280 Dementia in other diseases classified elsewhere without behavioral disturbance: Secondary | ICD-10-CM | POA: Diagnosis not present

## 2014-07-01 DIAGNOSIS — F419 Anxiety disorder, unspecified: Secondary | ICD-10-CM | POA: Diagnosis not present

## 2014-07-01 DIAGNOSIS — I10 Essential (primary) hypertension: Secondary | ICD-10-CM | POA: Insufficient documentation

## 2014-07-01 DIAGNOSIS — W19XXXA Unspecified fall, initial encounter: Secondary | ICD-10-CM

## 2014-07-01 DIAGNOSIS — Z87891 Personal history of nicotine dependence: Secondary | ICD-10-CM | POA: Diagnosis not present

## 2014-07-01 DIAGNOSIS — Z87440 Personal history of urinary (tract) infections: Secondary | ICD-10-CM | POA: Insufficient documentation

## 2014-07-01 DIAGNOSIS — Z9181 History of falling: Secondary | ICD-10-CM | POA: Insufficient documentation

## 2014-07-01 DIAGNOSIS — G309 Alzheimer's disease, unspecified: Secondary | ICD-10-CM | POA: Diagnosis not present

## 2014-07-01 DIAGNOSIS — F329 Major depressive disorder, single episode, unspecified: Secondary | ICD-10-CM | POA: Diagnosis not present

## 2014-07-01 DIAGNOSIS — M2560 Stiffness of unspecified joint, not elsewhere classified: Secondary | ICD-10-CM | POA: Insufficient documentation

## 2014-07-01 DIAGNOSIS — W1839XA Other fall on same level, initial encounter: Secondary | ICD-10-CM | POA: Insufficient documentation

## 2014-07-01 DIAGNOSIS — Z853 Personal history of malignant neoplasm of breast: Secondary | ICD-10-CM | POA: Diagnosis not present

## 2014-07-01 DIAGNOSIS — Y998 Other external cause status: Secondary | ICD-10-CM | POA: Insufficient documentation

## 2014-07-01 DIAGNOSIS — E039 Hypothyroidism, unspecified: Secondary | ICD-10-CM | POA: Diagnosis not present

## 2014-07-01 DIAGNOSIS — Y9389 Activity, other specified: Secondary | ICD-10-CM | POA: Diagnosis not present

## 2014-07-01 DIAGNOSIS — Z043 Encounter for examination and observation following other accident: Secondary | ICD-10-CM | POA: Diagnosis present

## 2014-07-01 DIAGNOSIS — Z7982 Long term (current) use of aspirin: Secondary | ICD-10-CM | POA: Insufficient documentation

## 2014-07-01 NOTE — ED Provider Notes (Addendum)
CSN: [id number]     Arrival date & time 07/01/14  6283 History   First MD Initiated Contact with Patient 07/01/14 1109     Chief Complaint  Patient presents with  . Fall     (Consider location/radiation/quality/duration/timing/severity/associated sxs/prior Treatment) HPI Comments: Patient presents after sustaining a fall. She lives in a nursing home and has a history of frequent falls. She was found face down. It's unclear what led to the fall. She has dementia and her history is limited. Per report, she is at her baseline mental status. She hasn't been complaining of obvious discomfort.   Past Medical History  Diagnosis Date  . Hypertension   . Alzheimer disease   . Depression   . Breast cancer   . Sleep apnea   . Hypothyroidism   . Anxiety   . Syncope and collapse   . Sinus bradycardia   . UTI (urinary tract infection)     ACUTE  . Dehydration     ACUTE  . Bradycardia    Past Surgical History  Procedure Laterality Date  . Cholecystectomy    . Breast surgery      bilteral mastectomy's   Family History  Problem Relation Age of Onset  . Heart failure Mother   . Parkinsonism Father    History  Substance Use Topics  . Smoking status: Former Smoker    Quit date: 12/21/1987  . Smokeless tobacco: Never Used  . Alcohol Use: No   OB History    No data available     Review of Systems  Unable to perform ROS: Dementia      Allergies  Ace inhibitors; Bystolic; and Ciprofloxacin  Home Medications   Prior to Admission medications   Medication Sig Start Date End Date Taking? Authorizing Provider  aspirin EC 81 MG tablet Take 81 mg by mouth daily.    Historical Provider, MD  Calcium Carbonate-Vitamin D (CALCIUM + D PO) Take 600 mg by mouth 2 (two) times daily.      Historical Provider, MD  Dermatological Products, [place]. (NUVAIL) SOLN Apply 1 application topically at bedtime. Applied to nails on both feet.    Historical Provider, MD  divalproex (DEPAKOTE) 125 MG DR  tablet Take 1 tablet (125 mg total) by mouth 4 (four) times daily as needed. 01/14/14   Nicole Pisciotta, PA-C  feeding supplement (ENSURE IMMUNE HEALTH) LIQD Take 237 mLs by mouth daily with supper. VANILLA    Historical Provider, MD  hydrochlorothiazide (HYDRODIURIL) 25 MG tablet Take 25 mg by mouth daily.    Historical Provider, MD  levothyroxine (SYNTHROID, LEVOTHROID) 100 MCG tablet Take 100 mcg by mouth daily.     Historical Provider, MD  LORazepam (ATIVAN) 0.5 MG tablet Take 0.5 mg by mouth at bedtime. 03/13/13   Blanchie Serve, MD  losartan (COZAAR) 25 MG tablet Take 25 mg by mouth 2 (two) times daily.      Historical Provider, MD  polyethylene glycol (MIRALAX / GLYCOLAX) packet Take 17 g by mouth daily as needed (for constipation).    Historical Provider, MD  senna-docusate (SENOKOT-S) 8.6-50 MG per tablet Take 2 tablets by mouth daily.    Historical Provider, MD  ursodiol (ACTIGALL) 300 MG capsule Take 300 mg by mouth 3 (three) times daily.     Historical Provider, MD  venlafaxine (EFFEXOR) 25 MG tablet Take 25 mg by mouth at bedtime.    Historical Provider, MD  vitamin B-12 (CYANOCOBALAMIN) 1000 MCG tablet Take 1,000 mcg by mouth daily.  Historical Provider, MD   BP 115/70 mmHg  Pulse 70  Temp(Src) 98.1 F (36.7 C) (Oral)  Resp 20  SpO2 98% Physical Exam  Constitutional: She appears well-developed and well-nourished.  HENT:  Head: Normocephalic and atraumatic.  Eyes: Pupils are equal, round, and reactive to light.  Neck: Normal range of motion. Neck supple.  No obvious discomfort around the cervical thoracic or lumbosacral spine  Cardiovascular: Normal rate, regular rhythm and normal heart sounds.   Pulmonary/Chest: Effort normal and breath sounds normal. No respiratory distress. She has no wheezes. She has no rales. She exhibits no tenderness.  Abdominal: Soft. Bowel sounds are normal. There is no tenderness. There is no rebound and no guarding.  Musculoskeletal: Normal range of  motion. She exhibits no edema.  No pain on palpation or range of motion extremities  Lymphadenopathy:    She has no cervical adenopathy.  Neurological: She is alert.  Alert but confused moves all actually symmetrically has some contractures of the extremities.  Skin: Skin is warm and dry. No rash noted.  Psychiatric: She has a normal mood and affect.    ED Course  Procedures (including critical care time) Labs Review Labs Reviewed - No data to display  Imaging Review Ct Head Wo Contrast  07/01/2014   CLINICAL DATA:  Unwitnessed fall.  EXAM: CT HEAD WITHOUT CONTRAST  CT CERVICAL SPINE WITHOUT CONTRAST  TECHNIQUE: Multidetector CT imaging of the head and cervical spine was performed following the standard protocol without intravenous contrast. Multiplanar CT image reconstructions of the cervical spine were also generated.  COMPARISON:  March 23, 2014.  FINDINGS: CT HEAD FINDINGS  Bony calvarium appears intact. Mild diffuse cortical atrophy is noted. No mass effect or midline shift is noted. Ventricular size is within normal limits. There is no evidence of mass lesion, hemorrhage or acute infarction.  CT CERVICAL SPINE FINDINGS  Reversal of normal lordosis of cervical spine is noted. No fracture is noted. Grade 1 anterolisthesis of C3-4 is noted secondary to posterior facet joint hypertrophy. Severe degenerative disc disease is noted at C5-6 and C6-7 with anterior osteophyte formation. Hypertrophy of posterior facet joints is noted at multiple levels in the upper cervical spine.  IMPRESSION: Mild diffuse cortical atrophy. No acute intracranial abnormality seen.  Severe degenerative disc disease is noted at C5-6 and C6-7. No acute abnormality is noted in the cervical spine.   Electronically Signed   By: Sabino Dick M.D.   On: 07/01/2014 12:09   Ct Cervical Spine Wo Contrast  07/01/2014   CLINICAL DATA:  Unwitnessed fall.  EXAM: CT HEAD WITHOUT CONTRAST  CT CERVICAL SPINE WITHOUT CONTRAST   TECHNIQUE: Multidetector CT imaging of the head and cervical spine was performed following the standard protocol without intravenous contrast. Multiplanar CT image reconstructions of the cervical spine were also generated.  COMPARISON:  March 23, 2014.  FINDINGS: CT HEAD FINDINGS  Bony calvarium appears intact. Mild diffuse cortical atrophy is noted. No mass effect or midline shift is noted. Ventricular size is within normal limits. There is no evidence of mass lesion, hemorrhage or acute infarction.  CT CERVICAL SPINE FINDINGS  Reversal of normal lordosis of cervical spine is noted. No fracture is noted. Grade 1 anterolisthesis of C3-4 is noted secondary to posterior facet joint hypertrophy. Severe degenerative disc disease is noted at C5-6 and C6-7 with anterior osteophyte formation. Hypertrophy of posterior facet joints is noted at multiple levels in the upper cervical spine.  IMPRESSION: Mild diffuse cortical atrophy. No acute  intracranial abnormality seen.  Severe degenerative disc disease is noted at C5-6 and C6-7. No acute abnormality is noted in the cervical spine.   Electronically Signed   By: Sabino Dick M.D.   On: 07/01/2014 12:09     EKG Interpretation None      MDM   Final diagnoses:  Fall, initial encounter    Patient was found after a fall. She has a history of frequent falls. She has no abnormal vital signs or depressed mental status which would make me concerned about other etiologies for the fall. Given that she was found face down, I did do a CT of her head and cervical spine which were negative for acute injuries. She was discharged back to the nursing home.    Malvin Johns, MD 07/01/14 [place], MD 07/01/14 [phone]

## 2014-07-01 NOTE — Discharge Instructions (Signed)
Fall Prevention in Hospitals °As a hospital patient, your condition and the treatments you receive can increase your risk for falls. Some additional risk factors for falls in a hospital include: °· Being in an unfamiliar environment. °· Being on bed rest. °· Your surgery. °· Taking certain medicines. °· Your tubing requirements, such as intravenous (IV) therapy or catheters. °It is important that you learn how to decrease fall risks while at the hospital. Below are important tips that can help prevent falls. °SAFETY TIPS FOR PREVENTING FALLS °Talk about your risk of falling. °· Ask your caregiver why you are at risk for falling. Is it your medicine, illness, tubing placement, or something else? °· Make a plan with your caregiver to keep you safe from falls. °· Ask your caregiver or pharmacist about side effect of your medicines. Some medicines can make you dizzy or affect your coordination. °Ask for help. °· Ask for help before getting out of bed. You may need to press your call button. °· Ask for assistance in getting you safely to the toilet. °· Ask for a walker or cane to be put at your bedside. Ask that most of the side rails on your bed be placed up before your caregiver leaves the room. °· Ask family or friends to sit with you. °· Ask for things that are out of your reach, such as your glasses, hearing aids, telephone, bedside table, or call button. °Follow these tips to avoid falling: °· Stay lying or seated, rather than standing, while waiting for help. °· Wear rubber-soled slippers or shoes whenever you walk in the hospital. °· Avoid quick, sudden movements. °¨ Change positions slowly. °¨ Sit on the side of your bed before standing. °¨ Stand up slowly and wait before you start to walk. °· Let your caregiver know if there is a spill on the floor. °· Pay careful attention to the medical equipment, electrical cords, and tubes around you. °· When you need help, use your call button by your bed or in the  bathroom. Wait for one of your caregivers to help you. °· If you feel dizzy or unsure of your footing, return to bed and wait for assistance. °· Avoid being distracted by the TV, telephone, or another person in your room. °· Do not lean or support yourself on rolling objects, such as IV poles or bedside tables. °Document Released: 07/27/2000 Document Revised: 07/16/2012 Document Reviewed: 04/06/2012 °ExitCare® Patient Information ©2015 [employer]. This information is not intended to replace advice given to you by your health care provider. Make sure you discuss any questions you have with your health care provider. ° °

## 2014-07-01 NOTE — ED Notes (Signed)
EMS reports patient was found lying face down in the floor.  Unwitnessed fall.  No trauma noted to face neck or body.  Orientation per her norm.

## 2014-07-11 ENCOUNTER — Encounter (HOSPITAL_BASED_OUTPATIENT_CLINIC_OR_DEPARTMENT_OTHER): Payer: Self-pay

## 2014-07-11 ENCOUNTER — Emergency Department (HOSPITAL_BASED_OUTPATIENT_CLINIC_OR_DEPARTMENT_OTHER): Payer: Medicare Other

## 2014-07-11 ENCOUNTER — Emergency Department (HOSPITAL_BASED_OUTPATIENT_CLINIC_OR_DEPARTMENT_OTHER)
Admission: EM | Admit: 2014-07-11 | Discharge: 2014-07-11 | Disposition: A | Discharge status: Home / Self Care | Payer: Medicare Other | Attending: Emergency Medicine | Admitting: Emergency Medicine

## 2014-07-11 DIAGNOSIS — G309 Alzheimer's disease, unspecified: Secondary | ICD-10-CM | POA: Insufficient documentation

## 2014-07-11 DIAGNOSIS — F329 Major depressive disorder, single episode, unspecified: Secondary | ICD-10-CM | POA: Insufficient documentation

## 2014-07-11 DIAGNOSIS — I10 Essential (primary) hypertension: Secondary | ICD-10-CM | POA: Diagnosis not present

## 2014-07-11 DIAGNOSIS — Y998 Other external cause status: Secondary | ICD-10-CM | POA: Insufficient documentation

## 2014-07-11 DIAGNOSIS — Y92129 Unspecified place in nursing home as the place of occurrence of the external cause: Secondary | ICD-10-CM | POA: Insufficient documentation

## 2014-07-11 DIAGNOSIS — W1839XA Other fall on same level, initial encounter: Secondary | ICD-10-CM | POA: Diagnosis not present

## 2014-07-11 DIAGNOSIS — Z87891 Personal history of nicotine dependence: Secondary | ICD-10-CM | POA: Diagnosis not present

## 2014-07-11 DIAGNOSIS — Z7982 Long term (current) use of aspirin: Secondary | ICD-10-CM | POA: Insufficient documentation

## 2014-07-11 DIAGNOSIS — F419 Anxiety disorder, unspecified: Secondary | ICD-10-CM | POA: Diagnosis not present

## 2014-07-11 DIAGNOSIS — Z8669 Personal history of other diseases of the nervous system and sense organs: Secondary | ICD-10-CM | POA: Diagnosis not present

## 2014-07-11 DIAGNOSIS — F0280 Dementia in other diseases classified elsewhere without behavioral disturbance: Secondary | ICD-10-CM | POA: Diagnosis not present

## 2014-07-11 DIAGNOSIS — Z853 Personal history of malignant neoplasm of breast: Secondary | ICD-10-CM | POA: Diagnosis not present

## 2014-07-11 DIAGNOSIS — Z87440 Personal history of urinary (tract) infections: Secondary | ICD-10-CM | POA: Diagnosis not present

## 2014-07-11 DIAGNOSIS — Z79899 Other long term (current) drug therapy: Secondary | ICD-10-CM | POA: Diagnosis not present

## 2014-07-11 DIAGNOSIS — S50311A Abrasion of right elbow, initial encounter: Secondary | ICD-10-CM | POA: Diagnosis not present

## 2014-07-11 DIAGNOSIS — Y9389 Activity, other specified: Secondary | ICD-10-CM | POA: Diagnosis not present

## 2014-07-11 DIAGNOSIS — E039 Hypothyroidism, unspecified: Secondary | ICD-10-CM | POA: Insufficient documentation

## 2014-07-11 DIAGNOSIS — W19XXXA Unspecified fall, initial encounter: Secondary | ICD-10-CM

## 2014-07-11 DIAGNOSIS — M25521 Pain in right elbow: Secondary | ICD-10-CM | POA: Diagnosis present

## 2014-07-11 NOTE — ED Notes (Signed)
Pt found on bathroom floor by staff at [facility] hx dementia and unable to determine if pt lost consciousness but was alert to norm on EMS arrival

## 2014-07-11 NOTE — ED Provider Notes (Addendum)
CSN: [id number]     Arrival date & time 07/11/14  0114 History   First MD Initiated Contact with Patient 07/11/14 0244     Chief Complaint  Patient presents with  . Fall     (Consider location/radiation/quality/duration/timing/severity/associated sxs/prior Treatment) Patient is a 80 y.o. female presenting with fall. The history is provided by the nursing home. The history is limited by the condition of the patient (Dementia).  Fall  She was apparently found on the floor at the nursing home where she resides. She has severe dementia and is not able to communicate any recent history. It is not known if she had loss of consciousness. Nursing home states that her mental status is at its baseline.  Past Medical History  Diagnosis Date  . Hypertension   . Alzheimer disease   . Depression   . Breast cancer   . Sleep apnea   . Hypothyroidism   . Anxiety   . Syncope and collapse   . Sinus bradycardia   . UTI (urinary tract infection)     ACUTE  . Dehydration     ACUTE  . Bradycardia    Past Surgical History  Procedure Laterality Date  . Cholecystectomy    . Breast surgery      bilteral mastectomy's   Family History  Problem Relation Age of Onset  . Heart failure Mother   . Parkinsonism Father    History  Substance Use Topics  . Smoking status: Former Smoker    Quit date: 12/21/1987  . Smokeless tobacco: Never Used  . Alcohol Use: No   OB History    No data available     Review of Systems  Unable to perform ROS: Dementia      Allergies  Ace inhibitors; Bystolic; and Ciprofloxacin  Home Medications   Prior to Admission medications   Medication Sig Start Date End Date Taking? Authorizing Provider  aspirin EC 81 MG tablet Take 81 mg by mouth daily.    Historical Provider, MD  Calcium Carbonate-Vitamin D (CALCIUM + D PO) Take 600 mg by mouth 2 (two) times daily.      Historical Provider, MD  Dermatological Products, [place]. (NUVAIL) SOLN Apply 1 application  topically at bedtime. Applied to nails on both feet.    Historical Provider, MD  divalproex (DEPAKOTE) 125 MG DR tablet Take 1 tablet (125 mg total) by mouth 4 (four) times daily as needed. 01/14/14   Nicole Pisciotta, PA-C  feeding supplement (ENSURE IMMUNE HEALTH) LIQD Take 237 mLs by mouth daily with supper. VANILLA    Historical Provider, MD  hydrochlorothiazide (HYDRODIURIL) 25 MG tablet Take 25 mg by mouth daily.    Historical Provider, MD  levothyroxine (SYNTHROID, LEVOTHROID) 100 MCG tablet Take 100 mcg by mouth daily.     Historical Provider, MD  LORazepam (ATIVAN) 0.5 MG tablet Take 0.5 mg by mouth at bedtime. 03/13/13   Blanchie Serve, MD  losartan (COZAAR) 25 MG tablet Take 25 mg by mouth 2 (two) times daily.      Historical Provider, MD  polyethylene glycol (MIRALAX / GLYCOLAX) packet Take 17 g by mouth daily as needed (for constipation).    Historical Provider, MD  senna-docusate (SENOKOT-S) 8.6-50 MG per tablet Take 2 tablets by mouth daily.    Historical Provider, MD  ursodiol (ACTIGALL) 300 MG capsule Take 300 mg by mouth 3 (three) times daily.     Historical Provider, MD  venlafaxine (EFFEXOR) 25 MG tablet Take 25 mg by mouth at  bedtime.    Historical Provider, MD  vitamin B-12 (CYANOCOBALAMIN) 1000 MCG tablet Take 1,000 mcg by mouth daily.     Historical Provider, MD   BP 159/95 mmHg  Pulse 53  Temp(Src) 97.7 F (36.5 C) (Oral)  SpO2 100% Physical Exam  Nursing note and vitals reviewed.  80 year old female, resting comfortably and in no acute distress. Vital signs are significant for hypertension and bradycardia. Oxygen saturation is 100%, which is normal. Head is normocephalic and atraumatic. PERRLA, EOMI. Oropharynx is clear. Neck is immobilized in a stiff cervical collar. Neck is nontender without adenopathy or JVD. Back is nontender and there is no CVA tenderness. Lungs are clear without rales, wheezes, or rhonchi. Chest is nontender. Heart has regular rate and rhythm  without murmur. Abdomen is soft, flat, nontender without masses or hepatosplenomegaly and peristalsis is normoactive. Extremities have no cyanosis or edema, full range of motion is present. Abrasion is present on the lateral aspect of the right elbow. An old abrasion is present over the anterior aspect of the right lower leg with mild area of erythema surrounding it. Skin is warm and dry without rash. Neurologic: She is awake and oriented to person but not place or time, cranial nerves are intact, there are no motor or sensory deficits.  ED Course  Procedures (including critical care time)  Imaging Review Ct Head Wo Contrast  07/11/2014   CLINICAL DATA:  Found on bathroom floor. Question of loss of consciousness and fall. Concern for cervical spine or head injury. Initial encounter.  EXAM: CT HEAD WITHOUT CONTRAST  CT CERVICAL SPINE WITHOUT CONTRAST  TECHNIQUE: Multidetector CT imaging of the head and cervical spine was performed following the standard protocol without intravenous contrast. Multiplanar CT image reconstructions of the cervical spine were also generated.  COMPARISON:  CT of the head and cervical spine performed 07/01/2014  FINDINGS: CT HEAD FINDINGS  There is no evidence of acute infarction, mass lesion, or intra- or extra-axial hemorrhage on CT.  Prominence of the ventricles and sulci reflects moderate cortical volume loss. Mild periventricular white matter change likely reflects small vessel ischemic microangiopathy. A chronic lacunar infarct is noted at the left basal ganglia. A small chronic infarct is seen at the left cerebellar hemisphere. Mild cerebellar atrophy is noted.  The brainstem and fourth ventricle are within normal limits. The cerebral hemispheres demonstrate grossly normal gray-white differentiation. No mass effect or midline shift is seen.  There is no evidence of fracture; visualized osseous structures are unremarkable in appearance. The orbits are within normal limits.  The paranasal sinuses and mastoid air cells are well-aerated. No significant soft tissue abnormalities are seen.  CT CERVICAL SPINE FINDINGS  There is no evidence of acute fracture or subluxation. Reversal of the normal lordotic curvature of the cervical spine appears to be chronic in nature. Multilevel disc space narrowing is noted along the lower cervical spine. There is grade 1 anterolisthesis of C2 on C3, of C3 on C4 and of C4 on C5, reflecting underlying facet disease. Scattered anterior and posterior disc osteophyte complexes are seen along the lower cervical spine. Prevertebral soft tissues are within normal limits.  The thyroid gland is unremarkable in appearance. Dense scarring is noted at the lung apices. Mild calcification is seen at the carotid bifurcations bilaterally.  IMPRESSION: 1. No evidence of traumatic intracranial injury or fracture. 2. No evidence of acute fracture or subluxation along the cervical spine. 3. Moderate cortical volume loss and scattered small vessel ischemic microangiopathy. 4.  Chronic lacunar infarct at the left basal ganglia. Small chronic infarct at the left cerebellar hemisphere. 5. Degenerative change noted along the lower cervical spine. 6. Dense scarring noted at the lung apices. 7. Mild calcification seen at the carotid bifurcations bilaterally.   Electronically Signed   By: Garald Balding M.D.   On: 07/11/2014 07:18   Ct Cervical Spine Wo Contrast  07/11/2014   CLINICAL DATA:  Found on bathroom floor. Question of loss of consciousness and fall. Concern for cervical spine or head injury. Initial encounter.  EXAM: CT HEAD WITHOUT CONTRAST  CT CERVICAL SPINE WITHOUT CONTRAST  TECHNIQUE: Multidetector CT imaging of the head and cervical spine was performed following the standard protocol without intravenous contrast. Multiplanar CT image reconstructions of the cervical spine were also generated.  COMPARISON:  CT of the head and cervical spine performed 07/01/2014   FINDINGS: CT HEAD FINDINGS  There is no evidence of acute infarction, mass lesion, or intra- or extra-axial hemorrhage on CT.  Prominence of the ventricles and sulci reflects moderate cortical volume loss. Mild periventricular white matter change likely reflects small vessel ischemic microangiopathy. A chronic lacunar infarct is noted at the left basal ganglia. A small chronic infarct is seen at the left cerebellar hemisphere. Mild cerebellar atrophy is noted.  The brainstem and fourth ventricle are within normal limits. The cerebral hemispheres demonstrate grossly normal gray-white differentiation. No mass effect or midline shift is seen.  There is no evidence of fracture; visualized osseous structures are unremarkable in appearance. The orbits are within normal limits. The paranasal sinuses and mastoid air cells are well-aerated. No significant soft tissue abnormalities are seen.  CT CERVICAL SPINE FINDINGS  There is no evidence of acute fracture or subluxation. Reversal of the normal lordotic curvature of the cervical spine appears to be chronic in nature. Multilevel disc space narrowing is noted along the lower cervical spine. There is grade 1 anterolisthesis of C2 on C3, of C3 on C4 and of C4 on C5, reflecting underlying facet disease. Scattered anterior and posterior disc osteophyte complexes are seen along the lower cervical spine. Prevertebral soft tissues are within normal limits.  The thyroid gland is unremarkable in appearance. Dense scarring is noted at the lung apices. Mild calcification is seen at the carotid bifurcations bilaterally.  IMPRESSION: 1. No evidence of traumatic intracranial injury or fracture. 2. No evidence of acute fracture or subluxation along the cervical spine. 3. Moderate cortical volume loss and scattered small vessel ischemic microangiopathy. 4. Chronic lacunar infarct at the left basal ganglia. Small chronic infarct at the left cerebellar hemisphere. 5. Degenerative change noted  along the lower cervical spine. 6. Dense scarring noted at the lung apices. 7. Mild calcification seen at the carotid bifurcations bilaterally.   Electronically Signed   By: Garald Balding M.D.   On: 07/11/2014 07:18   MDM   Final diagnoses:  Fall at nursing home, initial encounter  Abrasion of right elbow, initial encounter    Fall with abrasion to right elbow and no other apparent injury. Because of inability to communicate, CT of head and cervical spine will be checked. Old records are reviewed and she has prior ED visits for falls.  CT scan showed no acute injury. She is discharged to return to the nursing home.  Delora Fuel, MD 95/32/02 [id number]  Delora Fuel, MD 56/86/16 [id number]

## 2014-07-11 NOTE — Discharge Instructions (Signed)
Abrasion °An abrasion is a cut or scrape of the skin. Abrasions do not extend through all layers of the skin and most heal within 10 days. It is important to care for your abrasion properly to prevent infection. °CAUSES  °Most abrasions are caused by falling on, or gliding across, the ground or other surface. When your skin rubs on something, the outer and inner layer of skin rubs off, causing an abrasion. °DIAGNOSIS  °Your caregiver will be able to diagnose an abrasion during a physical exam.  °TREATMENT  °Your treatment depends on how large and deep the abrasion is. Generally, your abrasion will be cleaned with water and a mild soap to remove any dirt or debris. An antibiotic ointment may be put over the abrasion to prevent an infection. A bandage (dressing) may be wrapped around the abrasion to keep it from getting dirty.  °You may need a tetanus shot if: °· You cannot remember when you had your last tetanus shot. °· You have never had a tetanus shot. °· The injury broke your skin. °If you get a tetanus shot, your arm may swell, get red, and feel warm to the touch. This is common and not a problem. If you need a tetanus shot and you choose not to have one, there is a rare chance of getting tetanus. Sickness from tetanus can be serious.  °HOME CARE INSTRUCTIONS  °· If a dressing was applied, change it at least once a day or as directed by your caregiver. If the bandage sticks, soak it off with warm water.   °· Wash the area with water and a mild soap to remove all the ointment 2 times a day. Rinse off the soap and pat the area dry with a clean towel.   °· Reapply any ointment as directed by your caregiver. This will help prevent infection and keep the bandage from sticking. Use gauze over the wound and under the dressing to help keep the bandage from sticking.   °· Change your dressing right away if it becomes wet or dirty.   °· Only take over-the-counter or prescription medicines for pain, discomfort, or fever as  directed by your caregiver.   °· Follow up with your caregiver within 24-48 hours for a wound check, or as directed. If you were not given a wound-check appointment, look closely at your abrasion for redness, swelling, or pus. These are signs of infection. °SEEK IMMEDIATE MEDICAL CARE IF:  °· You have increasing pain in the wound.   °· You have redness, swelling, or tenderness around the wound.   °· You have pus coming from the wound.   °· You have a fever or persistent symptoms for more than 2-3 days. °· You have a fever and your symptoms suddenly get worse. °· You have a bad smell coming from the wound or dressing.   °MAKE SURE YOU:  °· Understand these instructions. °· Will watch your condition. °· Will get help right away if you are not doing well or get worse. °Document Released: 05/09/2005 Document Revised: 07/16/2012 Document Reviewed: 07/03/2011 °ExitCare® Patient Information ©2015 [employer]. This information is not intended to replace advice given to you by your health care provider. Make sure you discuss any questions you have with your health care provider. ° °Fall Prevention and Home Safety °Falls cause injuries and can affect all age groups. It is possible to use preventive measures to significantly decrease the likelihood of falls. There are many simple measures which can make your home safer and prevent falls. °OUTDOORS °· Repair   cracks and edges of walkways and driveways. °· Remove high doorway thresholds. °· Trim shrubbery on the main path into your home. °· Have good outside lighting. °· Clear walkways of tools, rocks, debris, and clutter. °· Check that handrails are not broken and are securely fastened. Both sides of steps should have handrails. °· Have leaves, snow, and ice cleared regularly. °· Use sand or salt on walkways during winter months. °· In the garage, clean up grease or oil spills. °BATHROOM °· Install night lights. °· Install grab bars by the toilet and in the tub and  shower. °· Use non-skid mats or decals in the tub or shower. °· Place a plastic non-slip stool in the shower to sit on, if needed. °· Keep floors dry and clean up all water on the floor immediately. °· Remove soap buildup in the tub or shower on a regular basis. °· Secure bath mats with non-slip, double-sided rug tape. °· Remove throw rugs and tripping hazards from the floors. °BEDROOMS °· Install night lights. °· Make sure a bedside light is easy to reach. °· Do not use oversized bedding. °· Keep a telephone by your bedside. °· Have a firm chair with side arms to use for getting dressed. °· Remove throw rugs and tripping hazards from the floor. °KITCHEN °· Keep handles on pots and pans turned toward the center of the stove. Use back burners when possible. °· Clean up spills quickly and allow time for drying. °· Avoid walking on wet floors. °· Avoid hot utensils and knives. °· Position shelves so they are not too high or low. °· Place commonly used objects within easy reach. °· If necessary, use a sturdy step stool with a grab bar when reaching. °· Keep electrical cables out of the way. °· Do not use floor polish or wax that makes floors slippery. If you must use wax, use non-skid floor wax. °· Remove throw rugs and tripping hazards from the floor. °STAIRWAYS °· Never leave objects on stairs. °· Place handrails on both sides of stairways and use them. Fix any loose handrails. Make sure handrails on both sides of the stairways are as long as the stairs. °· Check carpeting to make sure it is firmly attached along stairs. Make repairs to worn or loose carpet promptly. °· Avoid placing throw rugs at the top or bottom of stairways, or properly secure the rug with carpet tape to prevent slippage. Get rid of throw rugs, if possible. °· Have an electrician put in a light switch at the top and bottom of the stairs. °OTHER FALL PREVENTION TIPS °· Wear low-heel or rubber-soled shoes that are supportive and fit well. Wear  closed toe shoes. °· When using a stepladder, make sure it is fully opened and both spreaders are firmly locked. Do not climb a closed stepladder. °· Add color or contrast paint or tape to grab bars and handrails in your home. Place contrasting color strips on first and last steps. °· Learn and use mobility aids as needed. Install an electrical emergency response system. °· Turn on lights to avoid dark areas. Replace light bulbs that burn out immediately. Get light switches that glow. °· Arrange furniture to create clear pathways. Keep furniture in the same place. °· Firmly attach carpet with non-skid or double-sided tape. °· Eliminate uneven floor surfaces. °· Select a carpet pattern that does not visually hide the edge of steps. °· Be aware of all pets. °OTHER HOME SAFETY TIPS °· Set the water temperature for   120° F (48.8° C). °· Keep emergency numbers on or near the telephone. °· Keep smoke detectors on every level of the home and near sleeping areas. °Document Released: 07/20/2002 Document Revised: 01/29/2012 Document Reviewed: 10/19/2011 °ExitCare® Patient Information ©2015 [employer]. This information is not intended to replace advice given to you by your health care provider. Make sure you discuss any questions you have with your health care provider. ° °

## 2014-07-11 NOTE — ED Notes (Signed)
[facility] called to transport pt to [facility], RN aware

## 2014-07-11 NOTE — ED Notes (Signed)
Transported to CT 

## 2014-07-11 NOTE — ED Notes (Signed)
Patient is resting comfortably in chair.

## 2014-10-30 ENCOUNTER — Emergency Department (HOSPITAL_BASED_OUTPATIENT_CLINIC_OR_DEPARTMENT_OTHER): Payer: Medicare Other

## 2014-10-30 ENCOUNTER — Inpatient Hospital Stay (HOSPITAL_BASED_OUTPATIENT_CLINIC_OR_DEPARTMENT_OTHER)
Admission: EM | Admit: 2014-10-30 | Discharge: 2014-11-03 | DRG: 070 | Disposition: A | Discharge status: Skilled Nursing Facility | Payer: Medicare Other | Attending: Internal Medicine | Admitting: Internal Medicine

## 2014-10-30 ENCOUNTER — Encounter (HOSPITAL_BASED_OUTPATIENT_CLINIC_OR_DEPARTMENT_OTHER): Payer: Self-pay | Admitting: *Deleted

## 2014-10-30 DIAGNOSIS — W19XXXA Unspecified fall, initial encounter: Secondary | ICD-10-CM | POA: Diagnosis present

## 2014-10-30 DIAGNOSIS — G9340 Encephalopathy, unspecified: Principal | ICD-10-CM | POA: Diagnosis present

## 2014-10-30 DIAGNOSIS — E43 Unspecified severe protein-calorie malnutrition: Secondary | ICD-10-CM | POA: Diagnosis not present

## 2014-10-30 DIAGNOSIS — Z515 Encounter for palliative care: Secondary | ICD-10-CM

## 2014-10-30 DIAGNOSIS — Z87891 Personal history of nicotine dependence: Secondary | ICD-10-CM

## 2014-10-30 DIAGNOSIS — Z681 Body mass index (BMI) 19 or less, adult: Secondary | ICD-10-CM

## 2014-10-30 DIAGNOSIS — E876 Hypokalemia: Secondary | ICD-10-CM | POA: Diagnosis present

## 2014-10-30 DIAGNOSIS — R627 Adult failure to thrive: Secondary | ICD-10-CM | POA: Diagnosis present

## 2014-10-30 DIAGNOSIS — D72829 Elevated white blood cell count, unspecified: Secondary | ICD-10-CM | POA: Diagnosis present

## 2014-10-30 DIAGNOSIS — G309 Alzheimer's disease, unspecified: Secondary | ICD-10-CM

## 2014-10-30 DIAGNOSIS — Z853 Personal history of malignant neoplasm of breast: Secondary | ICD-10-CM

## 2014-10-30 DIAGNOSIS — R531 Weakness: Secondary | ICD-10-CM | POA: Diagnosis present

## 2014-10-30 DIAGNOSIS — S02113A Unspecified occipital condyle fracture, initial encounter for closed fracture: Secondary | ICD-10-CM | POA: Diagnosis not present

## 2014-10-30 DIAGNOSIS — E039 Hypothyroidism, unspecified: Secondary | ICD-10-CM | POA: Diagnosis present

## 2014-10-30 DIAGNOSIS — Z9049 Acquired absence of other specified parts of digestive tract: Secondary | ICD-10-CM | POA: Diagnosis not present

## 2014-10-30 DIAGNOSIS — F329 Major depressive disorder, single episode, unspecified: Secondary | ICD-10-CM | POA: Diagnosis present

## 2014-10-30 DIAGNOSIS — R4182 Altered mental status, unspecified: Secondary | ICD-10-CM | POA: Insufficient documentation

## 2014-10-30 DIAGNOSIS — R296 Repeated falls: Secondary | ICD-10-CM | POA: Diagnosis not present

## 2014-10-30 DIAGNOSIS — Z7982 Long term (current) use of aspirin: Secondary | ICD-10-CM | POA: Diagnosis not present

## 2014-10-30 DIAGNOSIS — S12000A Unspecified displaced fracture of first cervical vertebra, initial encounter for closed fracture: Secondary | ICD-10-CM | POA: Diagnosis present

## 2014-10-30 DIAGNOSIS — Y92129 Unspecified place in nursing home as the place of occurrence of the external cause: Secondary | ICD-10-CM

## 2014-10-30 DIAGNOSIS — F419 Anxiety disorder, unspecified: Secondary | ICD-10-CM | POA: Diagnosis not present

## 2014-10-30 DIAGNOSIS — Z66 Do not resuscitate: Secondary | ICD-10-CM | POA: Diagnosis present

## 2014-10-30 DIAGNOSIS — I10 Essential (primary) hypertension: Secondary | ICD-10-CM | POA: Diagnosis not present

## 2014-10-30 DIAGNOSIS — G4730 Sleep apnea, unspecified: Secondary | ICD-10-CM | POA: Diagnosis not present

## 2014-10-30 DIAGNOSIS — M542 Cervicalgia: Secondary | ICD-10-CM

## 2014-10-30 DIAGNOSIS — F0281 Dementia in other diseases classified elsewhere with behavioral disturbance: Secondary | ICD-10-CM | POA: Diagnosis not present

## 2014-10-30 DIAGNOSIS — S0003XA Contusion of scalp, initial encounter: Secondary | ICD-10-CM | POA: Diagnosis present

## 2014-10-30 DIAGNOSIS — F0280 Dementia in other diseases classified elsewhere without behavioral disturbance: Secondary | ICD-10-CM | POA: Diagnosis present

## 2014-10-30 DIAGNOSIS — Z888 Allergy status to other drugs, medicaments and biological substances status: Secondary | ICD-10-CM

## 2014-10-30 DIAGNOSIS — D696 Thrombocytopenia, unspecified: Secondary | ICD-10-CM | POA: Diagnosis present

## 2014-10-30 DIAGNOSIS — R400 Somnolence: Secondary | ICD-10-CM

## 2014-10-30 LAB — BASIC METABOLIC PANEL
Anion gap: 11 (ref 5–15)
BUN: 15 mg/dL (ref 6–23)
CHLORIDE: 100 mmol/L (ref 96–112)
CO2: 28 mmol/L (ref 19–32)
Calcium: 10.1 mg/dL (ref 8.4–10.5)
Creatinine, Ser: 0.68 mg/dL (ref 0.50–1.10)
GFR calc Af Amer: 90 mL/min — ABNORMAL LOW (ref >90)
GFR calc non Af Amer: 78 mL/min — ABNORMAL LOW (ref >90)
Glucose, Bld: 130 mg/dL — ABNORMAL HIGH (ref 70–99)
Potassium: 3.4 mmol/L — ABNORMAL LOW (ref 3.5–5.1)
SODIUM: 139 mmol/L (ref 135–145)

## 2014-10-30 LAB — URINALYSIS, ROUTINE W REFLEX MICROSCOPIC
BILIRUBIN URINE: NEGATIVE
Glucose, UA: NEGATIVE mg/dL
Ketones, ur: NEGATIVE mg/dL
Leukocytes, UA: NEGATIVE
Nitrite: NEGATIVE
PH: 7 (ref 5.0–8.0)
Protein, ur: NEGATIVE mg/dL
Specific Gravity, Urine: 1.01 (ref 1.005–1.030)
Urobilinogen, UA: 0.2 mg/dL (ref 0.0–1.0)

## 2014-10-30 LAB — CBC WITH DIFFERENTIAL/PLATELET
Basophils Absolute: 0 10*3/uL (ref 0.0–0.1)
Basophils Relative: 0 % (ref 0–1)
Eosinophils Absolute: 0 10*3/uL (ref 0.0–0.7)
Eosinophils Relative: 0 % (ref 0–5)
HCT: 38.8 % (ref 36.0–46.0)
HEMOGLOBIN: 13.1 g/dL (ref 12.0–15.0)
Lymphocytes Relative: 5 % — ABNORMAL LOW (ref 12–46)
Lymphs Abs: 0.6 10*3/uL — ABNORMAL LOW (ref 0.7–4.0)
MCH: 31 pg (ref 26.0–34.0)
MCHC: 33.8 g/dL (ref 30.0–36.0)
MCV: 91.7 fL (ref 78.0–100.0)
MONOS PCT: 4 % (ref 3–12)
Monocytes Absolute: 0.5 10*3/uL (ref 0.1–1.0)
NEUTROS PCT: 91 % — AB (ref 43–77)
Neutro Abs: 10.1 10*3/uL — ABNORMAL HIGH (ref 1.7–7.7)
Platelets: 119 10*3/uL — ABNORMAL LOW (ref 150–400)
RBC: 4.23 MIL/uL (ref 3.87–5.11)
RDW: 13.2 % (ref 11.5–15.5)
WBC: 11.1 10*3/uL — ABNORMAL HIGH (ref 4.0–10.5)

## 2014-10-30 LAB — CBG MONITORING, ED: Glucose-Capillary: 112 mg/dL — ABNORMAL HIGH (ref 70–99)

## 2014-10-30 LAB — URINE MICROSCOPIC-ADD ON

## 2014-10-30 MED ORDER — FENTANYL CITRATE 0.05 MG/ML IJ SOLN: 12.5000 ug | INTRAMUSCULAR | Status: DC | PRN

## 2014-10-30 MED ORDER — HYDRALAZINE HCL 20 MG/ML IJ SOLN: 10.0000 mg | INTRAMUSCULAR | Status: DC | PRN

## 2014-10-30 MED ORDER — SODIUM CHLORIDE 0.9 % IJ SOLN
3.0000 mL | Freq: Two times a day (BID) | INTRAMUSCULAR | Status: DC
  Administered 2014-11-01 – 2014-11-02 (×3): 3 mL via INTRAVENOUS

## 2014-10-30 MED ORDER — ACETAMINOPHEN 325 MG PO TABS: 650.0000 mg | ORAL_TABLET | Freq: Four times a day (QID) | ORAL | Status: DC | PRN

## 2014-10-30 MED ORDER — LEVOTHYROXINE SODIUM 25 MCG PO TABS: 125.0000 ug | ORAL_TABLET | Freq: Every day | ORAL | Status: DC

## 2014-10-30 MED ORDER — ONDANSETRON HCL 4 MG PO TABS: 4.0000 mg | ORAL_TABLET | Freq: Four times a day (QID) | ORAL | Status: DC | PRN

## 2014-10-30 MED ORDER — SODIUM CHLORIDE 0.9 % IV SOLN
Freq: Once | INTRAVENOUS | Status: AC
  Administered 2014-10-30: 75 mL/h via INTRAVENOUS

## 2014-10-30 MED ORDER — OLANZAPINE 2.5 MG PO TABS
2.5000 mg | ORAL_TABLET | Freq: Every day | ORAL | Status: DC
  Filled 2014-10-30 (×3): qty 1

## 2014-10-30 MED ORDER — TETANUS-DIPHTH-ACELL PERTUSSIS 5-2.5-18.5 LF-MCG/0.5 IM SUSP
0.5000 mL | Freq: Once | INTRAMUSCULAR | Status: AC
  Administered 2014-10-30: 0.5 mL via INTRAMUSCULAR
  Filled 2014-10-30: qty 0.5

## 2014-10-30 MED ORDER — ACETAMINOPHEN 650 MG RE SUPP: 650.0000 mg | Freq: Four times a day (QID) | RECTAL | Status: DC | PRN

## 2014-10-30 MED ORDER — LIDOCAINE-EPINEPHRINE 2 %-1:100000 IJ SOLN
20.0000 mL | Freq: Once | INTRAMUSCULAR | Status: AC
  Administered 2014-10-30: 10 mL
  Filled 2014-10-30: qty 1

## 2014-10-30 MED ORDER — ACETAMINOPHEN 325 MG PO TABS
650.0000 mg | ORAL_TABLET | Freq: Once | ORAL | Status: AC
  Administered 2014-10-30: 650 mg via ORAL
  Filled 2014-10-30: qty 2

## 2014-10-30 MED ORDER — TETANUS-DIPHTH-ACELL PERTUSSIS 5-2.5-18.5 LF-MCG/0.5 IM SUSP
INTRAMUSCULAR | Status: AC
  Filled 2014-10-30: qty 0.5

## 2014-10-30 MED ORDER — LORAZEPAM 2 MG/ML IJ SOLN
1.0000 mg | Freq: Once | INTRAMUSCULAR | Status: AC
  Administered 2014-10-30: 1 mg via INTRAMUSCULAR
  Filled 2014-10-30: qty 1

## 2014-10-30 MED ORDER — ONDANSETRON HCL 4 MG/2ML IJ SOLN: 4.0000 mg | Freq: Four times a day (QID) | INTRAMUSCULAR | Status: DC | PRN

## 2014-10-30 MED ORDER — URSODIOL 300 MG PO CAPS
300.0000 mg | ORAL_CAPSULE | Freq: Three times a day (TID) | ORAL | Status: DC
  Filled 2014-10-30 (×7): qty 1

## 2014-10-30 NOTE — Progress Notes (Signed)
PENDING ACCEPTANCE TRANFER NOTE:  Call received from:    Dr. Wyvonnia Dusky  REASON FOR REQUESTING TRANSFER:    [facility]  CC: Fall and C1 fracture  HPI:   14 demented, DNR , SNF resident was found on the floor in [facility] dementia facility, brought to [facility], initially was agitated and given Ativan. CT showed C1 fracture, Dr Christella Noa of [facility] recommended a C-collar and follow up in 2 weeks. Since she got the Ativan was not able to fully wake up or back to her baseline [facility] ambulatory). For Obs, r/o concussion.   PLAN:  According to telephone report, this patient was accepted for transfer to [facility],   Under [place] team:  [facility],  I have requested an order be written to call Flow Manager at [phone] upon patient arrival to the floor for final physician assignment who will do the admission and give admitting orders.  SIGNED: Birdie Hopes, MD [facility]  10/30/2014, 5:07 PM

## 2014-10-30 NOTE — ED Notes (Signed)
Pt brought in from nursing home post non-witnessed fall.  Large hematoma noted above right eye and large bleeding hematoma to dorsal upper forehead.  Hair around open bleeding wound trimmed, wound cleaned and prepped for MD evaluation.  Bleeding slowed with pressure dressing but active bleeding continues at site.  Pt moving around in bed, flexing bilateral knees, hips, arms and shoulders without difficulty.  Pt appears agitated and fidgeting during assessment.  Pt non-verbal per her basline but moans and grimaces with palpation to wound on forehead.  Pt appears to be trying to move around or get out of bed, lifting legs up over side rails.  Seizure pads placed along bilateral bed rails for patient safety.

## 2014-10-30 NOTE — ED Notes (Signed)
[facility] here for transport 

## 2014-10-30 NOTE — Progress Notes (Signed)
Spoke with physician. Tele placed, head CT ordered. Continuing to monitor. Merwyn Hodapp, Rande Brunt, RN

## 2014-10-30 NOTE — ED Notes (Addendum)
Arrived ems  From fall lac to forehead,bleeding controlled.  Abrasion to left knee, abrasion to rt side of face,  Per report pt mentally at baseline

## 2014-10-30 NOTE — ED Provider Notes (Addendum)
CSN: [id number]     Arrival date & time 10/30/14  0701 History   First MD Initiated Contact with Patient 10/30/14 [phone]     Chief Complaint  Patient presents with  . Fall     (Consider location/radiation/quality/duration/timing/severity/associated sxs/prior Treatment) HPI  This is an 81 year old female with a history of Alzheimer's dementia who presents following a fall. Patient was found on the floor this morning during morning rounds at her memory care unit. Noted to have a laceration to the forehead and abrasion to the left knee.  Patient is nonverbal. DO NOT RESUSCITATE per EMS. Does not take any anticoagulants but does take an aspirin daily.  Level V caveat for dementia.  Past Medical History  Diagnosis Date  . Hypertension   . Alzheimer disease   . Depression   . Breast cancer   . Sleep apnea   . Hypothyroidism   . Anxiety   . Syncope and collapse   . Sinus bradycardia   . UTI (urinary tract infection)     ACUTE  . Dehydration     ACUTE  . Bradycardia    Past Surgical History  Procedure Laterality Date  . Cholecystectomy    . Breast surgery      bilteral mastectomy's   Family History  Problem Relation Age of Onset  . Heart failure Mother   . Parkinsonism Father    History  Substance Use Topics  . Smoking status: Former Smoker    Quit date: 12/21/1987  . Smokeless tobacco: Never Used  . Alcohol Use: No   OB History    No data available     Review of Systems  Unable to perform ROS: Dementia      Allergies  Ace inhibitors; Bystolic; and Ciprofloxacin  Home Medications   Prior to Admission medications   Medication Sig Start Date End Date Taking? Authorizing Provider  aspirin EC 81 MG tablet Take 81 mg by mouth daily.    Historical Provider, MD  Calcium Carbonate-Vitamin D (CALCIUM + D PO) Take 600 mg by mouth 2 (two) times daily.      Historical Provider, MD  Dermatological Products, [place]. (NUVAIL) SOLN Apply 1 application topically at bedtime.  Applied to nails on both feet.    Historical Provider, MD  divalproex (DEPAKOTE) 125 MG DR tablet Take 1 tablet (125 mg total) by mouth 4 (four) times daily as needed. 01/14/14   Nicole Pisciotta, PA-C  feeding supplement (ENSURE IMMUNE HEALTH) LIQD Take 237 mLs by mouth daily with supper. VANILLA    Historical Provider, MD  hydrochlorothiazide (HYDRODIURIL) 25 MG tablet Take 25 mg by mouth daily.    Historical Provider, MD  levothyroxine (SYNTHROID, LEVOTHROID) 100 MCG tablet Take 100 mcg by mouth daily.     Historical Provider, MD  LORazepam (ATIVAN) 0.5 MG tablet Take 0.5 mg by mouth at bedtime. 03/13/13   Blanchie Serve, MD  losartan (COZAAR) 25 MG tablet Take 25 mg by mouth 2 (two) times daily.      Historical Provider, MD  polyethylene glycol (MIRALAX / GLYCOLAX) packet Take 17 g by mouth daily as needed (for constipation).    Historical Provider, MD  senna-docusate (SENOKOT-S) 8.6-50 MG per tablet Take 2 tablets by mouth daily.    Historical Provider, MD  ursodiol (ACTIGALL) 300 MG capsule Take 300 mg by mouth 3 (three) times daily.     Historical Provider, MD  venlafaxine (EFFEXOR) 25 MG tablet Take 25 mg by mouth at bedtime.    Historical  Provider, MD  vitamin B-12 (CYANOCOBALAMIN) 1000 MCG tablet Take 1,000 mcg by mouth daily.     Historical Provider, MD   BP 166/95 mmHg  Pulse 77  Temp(Src) 97.3 F (36.3 C) (Rectal)  Resp 18  SpO2 100% Physical Exam  Constitutional:  Agitated, ABC's intact  HENT:  Head: Normocephalic.  Large Hematoma over the middle of the forehead with 6 cm laceration, active oozing noted, contusion to the right eye  Eyes: Pupils are equal, round, and reactive to light.  Neck: Neck supple.  Cardiovascular: Normal rate and regular rhythm.   Pulmonary/Chest: Effort normal. No respiratory distress.  Abdominal: Soft. Bowel sounds are normal. There is no tenderness. There is no rebound.  Musculoskeletal:  Abrasion to the lateral aspect of the left knee   Neurological: She is alert.  Nonverbal, moves all 4 extremities   Skin: Skin is warm and dry.  Abrasion and laceration as noted above  Nursing note and vitals reviewed.   ED Course  Procedures (including critical care time)  LACERATION REPAIR Performed by: Merryl Hacker Authorized by: Merryl Hacker Consent: Verbal consent obtained. Risks and benefits: risks, benefits and alternatives were discussed Consent given by: patient Patient identity confirmed: provided demographic data Prepped and Draped in normal sterile fashion Wound explored  Laceration Location: forehead  Laceration Length: 6 cm  No Foreign Bodies seen or palpated  Anesthesia: local infiltration  Local anesthetic: lidocaine 2% w epinephrine  Anesthetic total: 3 ml  Irrigation method: syringe Amount of cleaning: standard  Skin closure: 4-0 nylon   Number of sutures: 10 Technique: interrupted  Laceration complicated by underlying hematoma which was unable to be evacuated. Wound approximated loosely to control bleeding. Pressure dressing applied.  Patient tolerance: Patient tolerated the procedure well with no immediate complications. Labs Review Labs Reviewed  URINALYSIS, ROUTINE W REFLEX MICROSCOPIC - Abnormal; Notable for the following:    Hgb urine dipstick TRACE (*)    All other components within normal limits  URINE MICROSCOPIC-ADD ON - Abnormal; Notable for the following:    Casts HYALINE CASTS (*)    All other components within normal limits  CBC WITH DIFFERENTIAL/PLATELET - Abnormal; Notable for the following:    WBC 11.1 (*)    Platelets 119 (*)    Neutrophils Relative % 91 (*)    Neutro Abs 10.1 (*)    Lymphocytes Relative 5 (*)    Lymphs Abs 0.6 (*)    All other components within normal limits  BASIC METABOLIC PANEL - Abnormal; Notable for the following:    Potassium 3.4 (*)    Glucose, Bld 130 (*)    GFR calc non Af Amer 78 (*)    GFR calc Af Amer 90 (*)    All other  components within normal limits  CBG MONITORING, ED - Abnormal; Notable for the following:    Glucose-Capillary 112 (*)    All other components within normal limits    Imaging Review Ct Head Wo Contrast  10/30/2014   CLINICAL DATA:  Fall, scalp laceration over the frontal region, patient cannot give history  EXAM: CT HEAD WITHOUT CONTRAST  CT CERVICAL SPINE WITHOUT CONTRAST  TECHNIQUE: Multidetector CT imaging of the head and cervical spine was performed following the standard protocol without intravenous contrast. Multiplanar CT image reconstructions of the cervical spine were also generated.  COMPARISON:  07/11/2014  FINDINGS: CT HEAD FINDINGS  Large frontal scalp laceration/hematoma. No skull fracture. Prominent nutrient foramen left orbital roof stable from 07/11/2014. Motion  artifact severely degrades this study. There is diffuse atrophy with no abnormal attenuation to suggest infarct mass hemorrhage or extra-axial fluid. Proportional dilatation of the ventricles noted.  CT CERVICAL SPINE FINDINGS  There are fractures of the anterior and posterior aspects of the C1 ring, both mildly displaced with comminution involving the posterior right C1 fracture. There is a mildly displaced fracture involving the left occipital condyle.  There is reversed lordosis. There is severe C4-5, C5-6, and C6-7 degenerative disc disease. There are no other fractures.  IMPRESSION: 1. No acute intracranial abnormality. Study moderately limited by motion. 2. Acute fractures of C1 and left occipital condyle. Critical Value/emergent results were called by telephone at the time of interpretation on 10/30/2014 at 8:39 am to Dr. Thayer Jew , who verbally acknowledged these results.   Electronically Signed   By: Skipper Cliche M.D.   On: 10/30/2014 08:39   Ct Cervical Spine Wo Contrast  10/30/2014   CLINICAL DATA:  Fall, scalp laceration over the frontal region, patient cannot give history  EXAM: CT HEAD WITHOUT CONTRAST  CT  CERVICAL SPINE WITHOUT CONTRAST  TECHNIQUE: Multidetector CT imaging of the head and cervical spine was performed following the standard protocol without intravenous contrast. Multiplanar CT image reconstructions of the cervical spine were also generated.  COMPARISON:  07/11/2014  FINDINGS: CT HEAD FINDINGS  Large frontal scalp laceration/hematoma. No skull fracture. Prominent nutrient foramen left orbital roof stable from 07/11/2014. Motion artifact severely degrades this study. There is diffuse atrophy with no abnormal attenuation to suggest infarct mass hemorrhage or extra-axial fluid. Proportional dilatation of the ventricles noted.  CT CERVICAL SPINE FINDINGS  There are fractures of the anterior and posterior aspects of the C1 ring, both mildly displaced with comminution involving the posterior right C1 fracture. There is a mildly displaced fracture involving the left occipital condyle.  There is reversed lordosis. There is severe C4-5, C5-6, and C6-7 degenerative disc disease. There are no other fractures.  IMPRESSION: 1. No acute intracranial abnormality. Study moderately limited by motion. 2. Acute fractures of C1 and left occipital condyle. Critical Value/emergent results were called by telephone at the time of interpretation on 10/30/2014 at 8:39 am to Dr. Thayer Jew , who verbally acknowledged these results.   Electronically Signed   By: Skipper Cliche M.D.   On: 10/30/2014 08:39   Dg Knee Complete 4 Views Left  10/30/2014   CLINICAL DATA:  Fall, left knee abrasions, trauma, pain  EXAM: LEFT KNEE - COMPLETE 4+ VIEW  COMPARISON:  None.  FINDINGS: Osteopenia noted. Atrophy of the left lower extremity musculature. Normal alignment without acute displaced fracture or joint effusion. Lateral view is limited with motion artifact. Peripheral atherosclerosis noted.  IMPRESSION: No acute osseous finding or displaced fracture.  Osteopenia   Electronically Signed   By: Jerilynn Mages.  Shick M.D.   On: 10/30/2014 10:36      EKG Interpretation None      MDM   Final diagnoses:  Fall, initial encounter  C1 cervical fracture, closed, initial encounter    8:40am Call from radiologist. Patient with C1 fracture and occipital condyle fracture. Patient was not placed in a c-collar by EMS. C-collar placed. Patient moving all 4 extremities. Neurosurgery consulted.  9:23 AM Unable to get in contact with patient's power of attorney who is her son Cain Saupe phone [phone]. Was able to touch base with Laveda Norman the patient's sister at [phone]. Confirm DO NOT RESUSCITATE. Other goals of care unclear at this time.  9:50 AM Spoke with Dr. Christella Noa.  Agrees to c-collar placement. Nonsurgical. Follow-up in 2 weeks.  Patient status post Ativan and very sleepy. Not back to baseline. Normally ambulatory. Will allow patient to rest and when she wakes up we'll try to ambulate.    Son is now the bedside and agrees with plan. Discussed patient with nurse at [facility] [place].  States that if the patient is at her baseline, she can return to the living facility if family is willing to be with her for the next 24 hours.  3:30pm  Patient continues not to be her baseline. Have ordered basic lab work. Patient may need to be admitted for observation given that she is not at her baseline  Merryl Hacker, MD 10/30/14 [place], MD 10/30/14 [phone]

## 2014-10-30 NOTE — ED Notes (Addendum)
Pt arrived ems from fall,  Lac to forehead bleeding controlled,  Abrasion to left knee, abrasion to rt side of face,   Per ems pt at baseline

## 2014-10-30 NOTE — H&P (Signed)
[facility] History and Physical  Patient: [name] Briggs  MRN: [id number]  DOB: [birth year]  DOS: the patient was seen and examined on 10/30/2014 PCP: Margarita Mail, PA-C  Chief Complaint: Fall  HPI: [name] Briggs is a 81 y.o. female with Past medical history of hypertension, Alzheimer's dementia, hypothyroidism, recurrent fall, depression. The patient presents with history of fall. The fall was not witnessed. Patient is a resident of [facility] nursing [facility] and was found to be on the ground during the morning rounds. Patient was verbal communicative and following commands. Reportedly at her baseline she is ambulatory. Patient was seen in the ER and extensive workup was obtained. CT scan was positive for C1 fracture. Neurosurgery was consulted who recommended nonsurgical management and outpatient follow-up. Patient was agitated and was given 1 mg of lorazepam. Reportedly patient is on 0.5 mg of lorazepam every 4 hours as needed at nursing home. The patient is significantly drowsy and lethargic and was not at her baseline and therefore was recommended to be admitted for observation. At the time of my evaluation the patient was sleepy but occasionally following command. Patient did not cooperate with any history or examination.  The patient is coming from SNF. And at her baseline dependent for most of her ADL.  Review of Systems: as mentioned in the history of present illness.  A Comprehensive review of the other systems is negative.  Past Medical History  Diagnosis Date  . Hypertension   . Alzheimer disease   . Depression   . Breast cancer   . Sleep apnea   . Hypothyroidism   . Anxiety   . Syncope and collapse   . Sinus bradycardia   . UTI (urinary tract infection)     ACUTE  . Dehydration     ACUTE  . Bradycardia    Past Surgical History  Procedure Laterality Date  . Cholecystectomy    . Breast surgery      bilteral mastectomy's   Social History:   reports that she quit smoking about 26 years ago. She has never used smokeless tobacco. She reports that she does not drink alcohol or use illicit drugs.  Allergies  Allergen Reactions  . Ace Inhibitors     REACTION: Swelling  . Bystolic [Nebivolol Hcl] Other (See Comments)    Bradycardia  . Ciprofloxacin     REACTION: GI Upset    Family History  Problem Relation Age of Onset  . Heart failure Mother   . Parkinsonism Father     Prior to Admission medications   Medication Sig Start Date End Date Taking? Authorizing Provider  ferrous sulfate 325 (65 FE) MG tablet Take 325 mg by mouth daily with breakfast.   Yes Historical Provider, MD  OLANZapine (ZYPREXA) 2.5 MG tablet Take 2.5 mg by mouth at bedtime.   Yes Historical Provider, MD  psyllium (REGULOID) 0.52 G capsule Take 0.52 g by mouth daily.   Yes Historical Provider, MD  zolpidem (AMBIEN) 5 MG tablet Take 5 mg by mouth at bedtime as needed for sleep.   Yes Historical Provider, MD  Calcium Carbonate-Vitamin D (CALCIUM + D PO) Take 600 mg by mouth 2 (two) times daily.      Historical Provider, MD  feeding supplement (ENSURE IMMUNE HEALTH) LIQD Take 237 mLs by mouth daily with supper. VANILLA    Historical Provider, MD  levothyroxine (SYNTHROID, LEVOTHROID) 100 MCG tablet Take 125 mcg by mouth daily.     Historical Provider, MD  LORazepam (  ATIVAN) 0.5 MG tablet Take 0.5 mg by mouth every 4 (four) hours as needed for anxiety.  03/13/13   Blanchie Serve, MD  losartan (COZAAR) 25 MG tablet Take 25 mg by mouth 2 (two) times daily.      Historical Provider, MD  polyethylene glycol (MIRALAX / GLYCOLAX) packet Take 17 g by mouth daily as needed (for constipation).    Historical Provider, MD  senna-docusate (SENOKOT-S) 8.6-50 MG per tablet Take 2 tablets by mouth daily.    Historical Provider, MD  ursodiol (ACTIGALL) 300 MG capsule Take 300 mg by mouth 3 (three) times daily.     Historical Provider, MD    Physical Exam: Filed Vitals:    10/30/14 2019 10/30/14 2030 10/30/14 2127 10/30/14 2302  BP: 185/104 200/101 180/92 148/101  Pulse: 87 84 74 117  Temp:    98.6 F (37 C)  TempSrc:    Axillary  Resp:    16  Weight:    49.215 kg (108 lb 8 oz)  SpO2: 100% 100% 100% 98%    General: Appear in mild distress Eyes: PER SLUGGISHLY REACTIVE TO LIGHT  ENT: Oral Mucosa clear dry. Neck: Difficult to assess  JVD Cardiovascular: S1 and S2 Present, no Murmur, Peripheral Pulses Present Respiratory: Bilateral Air entry equal and Decreased, Clear to Auscultation, noCrackles, no wheezes Abdomen: Bowel Sound present, Soft and non tender Skin: Bruises Rash Extremities: no Pedal edema, Neurologic: Mental status patient maintaining close eyes but spontaneously moving all extremities. Cranial Nerves pupils are reactive Sensation withdraws to painful stimuli, reflexes difficult to elicit, babinski negative,  Labs on Admission:  CBC:  Recent Labs Lab 10/30/14 1530  WBC 11.1*  NEUTROABS 10.1*  HGB 13.1  HCT 38.8  MCV 91.7  PLT 119*    CMP     Component Value Date/Time   NA 139 10/30/2014 1530   K 3.4* 10/30/2014 1530   CL 100 10/30/2014 1530   CO2 28 10/30/2014 1530   GLUCOSE 130* 10/30/2014 1530   BUN 15 10/30/2014 1530   CREATININE 0.68 10/30/2014 1530   CALCIUM 10.1 10/30/2014 1530   PROT 7.2 01/14/2014 1802   ALBUMIN 3.4* 01/14/2014 1802   AST 23 01/14/2014 1802   ALT 14 01/14/2014 1802   ALKPHOS 55 01/14/2014 1802   BILITOT 0.2* 01/14/2014 1802   GFRNONAA 78* 10/30/2014 1530   GFRAA 90* 10/30/2014 1530    No results for input(s): LIPASE, AMYLASE in the last 168 hours.  No results for input(s): CKTOTAL, CKMB, CKMBINDEX, TROPONINI in the last 168 hours. BNP (last 3 results) No results for input(s): BNP in the last 8760 hours.  ProBNP (last 3 results) No results for input(s): PROBNP in the last 8760 hours.   Radiological Exams on Admission: Ct Head Wo Contrast  10/30/2014   CLINICAL DATA:  Fall, scalp  laceration over the frontal region, patient cannot give history  EXAM: CT HEAD WITHOUT CONTRAST  CT CERVICAL SPINE WITHOUT CONTRAST  TECHNIQUE: Multidetector CT imaging of the head and cervical spine was performed following the standard protocol without intravenous contrast. Multiplanar CT image reconstructions of the cervical spine were also generated.  COMPARISON:  07/11/2014  FINDINGS: CT HEAD FINDINGS  Large frontal scalp laceration/hematoma. No skull fracture. Prominent nutrient foramen left orbital roof stable from 07/11/2014. Motion artifact severely degrades this study. There is diffuse atrophy with no abnormal attenuation to suggest infarct mass hemorrhage or extra-axial fluid. Proportional dilatation of the ventricles noted.  CT CERVICAL SPINE FINDINGS  There are fractures  of the anterior and posterior aspects of the C1 ring, both mildly displaced with comminution involving the posterior right C1 fracture. There is a mildly displaced fracture involving the left occipital condyle.  There is reversed lordosis. There is severe C4-5, C5-6, and C6-7 degenerative disc disease. There are no other fractures.  IMPRESSION: 1. No acute intracranial abnormality. Study moderately limited by motion. 2. Acute fractures of C1 and left occipital condyle. Critical Value/emergent results were called by telephone at the time of interpretation on 10/30/2014 at 8:39 am to Dr. Thayer Jew , who verbally acknowledged these results.   Electronically Signed   By: Skipper Cliche M.D.   On: 10/30/2014 08:39   Ct Cervical Spine Wo Contrast  10/30/2014   CLINICAL DATA:  Fall, scalp laceration over the frontal region, patient cannot give history  EXAM: CT HEAD WITHOUT CONTRAST  CT CERVICAL SPINE WITHOUT CONTRAST  TECHNIQUE: Multidetector CT imaging of the head and cervical spine was performed following the standard protocol without intravenous contrast. Multiplanar CT image reconstructions of the cervical spine were also  generated.  COMPARISON:  07/11/2014  FINDINGS: CT HEAD FINDINGS  Large frontal scalp laceration/hematoma. No skull fracture. Prominent nutrient foramen left orbital roof stable from 07/11/2014. Motion artifact severely degrades this study. There is diffuse atrophy with no abnormal attenuation to suggest infarct mass hemorrhage or extra-axial fluid. Proportional dilatation of the ventricles noted.  CT CERVICAL SPINE FINDINGS  There are fractures of the anterior and posterior aspects of the C1 ring, both mildly displaced with comminution involving the posterior right C1 fracture. There is a mildly displaced fracture involving the left occipital condyle.  There is reversed lordosis. There is severe C4-5, C5-6, and C6-7 degenerative disc disease. There are no other fractures.  IMPRESSION: 1. No acute intracranial abnormality. Study moderately limited by motion. 2. Acute fractures of C1 and left occipital condyle. Critical Value/emergent results were called by telephone at the time of interpretation on 10/30/2014 at 8:39 am to Dr. Thayer Jew , who verbally acknowledged these results.   Electronically Signed   By: Skipper Cliche M.D.   On: 10/30/2014 08:39   Dg Knee Complete 4 Views Left  10/30/2014   CLINICAL DATA:  Fall, left knee abrasions, trauma, pain  EXAM: LEFT KNEE - COMPLETE 4+ VIEW  COMPARISON:  None.  FINDINGS: Osteopenia noted. Atrophy of the left lower extremity musculature. Normal alignment without acute displaced fracture or joint effusion. Lateral view is limited with motion artifact. Peripheral atherosclerosis noted.  IMPRESSION: No acute osseous finding or displaced fracture.  Osteopenia   Electronically Signed   By: Jerilynn Mages.  Shick M.D.   On: 10/30/2014 10:36   Ct Maxillofacial Wo Cm  10/30/2014   CLINICAL DATA:  Acute fall with forehead and right eye injury. Initial encounter.  EXAM: CT MAXILLOFACIAL WITHOUT CONTRAST  TECHNIQUE: Multidetector CT imaging of the maxillofacial structures was  performed. Multiplanar CT image reconstructions were also generated. A small metallic BB was placed on the right temple in order to reliably differentiate right from left.  COMPARISON:  10/30/2014, 07/11/2014 and prior CTs.  FINDINGS: No acute facial fracture identified.  Right preseptal facial soft tissue swelling is identified. The globes and orbits are unremarkable. There is no evidence of postseptal or intraconal abnormality.  The paranasal sinuses, mastoid air cells and middle/ inner ears are clear.  C1 and left occipital condyle fractures again noted as seen on earlier exams today.  IMPRESSION: No evidence of acute facial fracture.  Right preseptal facial soft  tissue swelling.   Electronically Signed   By: Margarette Canada M.D.   On: 10/30/2014 18:16    EKG: Independently reviewed. normal sinus rhythm, nonspecific ST and T waves changes.  Assessment/Plan Principal Problem:   Acute encephalopathy Active Problems:   Alzheimer disease   Hypothyroidism   Fall at nursing home   C1 cervical fracture   Scalp hematoma   1. Acute encephalopathy The patient is presenting with a fall which was not witnessed. The etiology of the fall is not clear. Blood workup showed patient had C1 fracture. She also was found to have multiple hematomas. Due to her persistent altered mental status she was recommended to be admitted for observation. She has received lorazepam at 7:00 in the morning and she continues to remain drowsy. I would check repeat a CT scan TSH and VBG. Monitor her on telemetry. Further workup depending on the results of the CT scan.  2. Hypothyroidism. Continue Synthroid check TSH.  3.C1 fracture. Neurosurgery has been consulted who recommended no surgical intervention and recommended follow-up as an outpatient. Continue serial neuro checks.  4. Hematoma  No active bleeding. Recheck H&H.  5.Fall. Etiology unclear. May require further workup. Currently CT head 2 is negative for any  acute abnormality. X-ray of the knee does not have any acute abnormality as well. Patient spontaneously moving all extremity and does not have any pain anywhere else in other joints.  6. Accelerated hypertension. Hydralazine as needed.   Advance goals of care discussion: DNR/DNI based on available documentation   Consults: Neurosurgery  DVT Prophylaxis: mechanical compression device. Nutrition: Nothing by mouth  Disposition: Admitted to observation in telemetry unit.  Author: Berle Mull, MD [facility] Pager: [phone] 10/30/2014, 11:44 PM    If 7PM-7AM, please contact night-coverage [email or web address] Password [email or web address]

## 2014-10-30 NOTE — Progress Notes (Signed)
Patient arrived from [place] agitated and restless. Patient transferred into bed from stretcher. Skin assessed but patient unable to follow commands or answer verbally. A wait restraint was on her but not being used when she arrived. Vitals taken and waiting for MD to see patient and place orders. IV fluids running when pt arrived. Continuing to monitor. Bed alarm in place for high fall risk patient. Lanore Renderos, Rande Brunt, RN

## 2014-10-30 NOTE — ED Notes (Signed)
Pt mildly restless, NAD, calm, persistant moving of extremities. MAEx4. Does not speak or follow commands. Keeping eyes closed. Eyes manually opened. PERRL 60mm sluggish. LS CTA shallow diminished.

## 2014-10-30 NOTE — ED Notes (Signed)
Given warm blankets & socks, returned to monitor. BP elevated. Restless.

## 2014-10-30 NOTE — ED Provider Notes (Signed)
Care assumed from Dr. Dina Rich. Patient had a fall at her nursing home this morning. His laceration to forehead. Imaging remarkable for C1 fracture which is nonoperative per neurosurgery. Patient remains somnolent after receiving Ativan. Labs pending and plan admission for observation  Labs appear to be at baseline. Admission d/w Dr. Hartford Poli.  BP 166/95 mmHg  Pulse 77  Temp(Src) 97.3 F (36.3 C) (Rectal)  Resp 18  SpO2 100%   Ezequiel Essex, MD 10/30/14 [phone]

## 2014-10-30 NOTE — ED Notes (Signed)
Pt arrived ems w lac to forehead, abrasion to left knee  Bleeding controlled,  Per staff pt at baseline

## 2014-10-31 ENCOUNTER — Observation Stay (HOSPITAL_COMMUNITY): Payer: Medicare Other

## 2014-10-31 ENCOUNTER — Encounter (HOSPITAL_COMMUNITY): Payer: Self-pay

## 2014-10-31 DIAGNOSIS — G9340 Encephalopathy, unspecified: Secondary | ICD-10-CM | POA: Diagnosis not present

## 2014-10-31 DIAGNOSIS — G309 Alzheimer's disease, unspecified: Secondary | ICD-10-CM | POA: Diagnosis not present

## 2014-10-31 DIAGNOSIS — E039 Hypothyroidism, unspecified: Secondary | ICD-10-CM | POA: Diagnosis not present

## 2014-10-31 DIAGNOSIS — S12000A Unspecified displaced fracture of first cervical vertebra, initial encounter for closed fracture: Secondary | ICD-10-CM | POA: Diagnosis present

## 2014-10-31 DIAGNOSIS — S0003XA Contusion of scalp, initial encounter: Secondary | ICD-10-CM | POA: Diagnosis present

## 2014-10-31 DIAGNOSIS — W19XXXA Unspecified fall, initial encounter: Secondary | ICD-10-CM | POA: Diagnosis present

## 2014-10-31 DIAGNOSIS — Y92129 Unspecified place in nursing home as the place of occurrence of the external cause: Secondary | ICD-10-CM

## 2014-10-31 LAB — CBC WITH DIFFERENTIAL/PLATELET
BASOS ABS: 0 10*3/uL (ref 0.0–0.1)
Basophils Relative: 0 % (ref 0–1)
EOS PCT: 0 % (ref 0–5)
Eosinophils Absolute: 0 10*3/uL (ref 0.0–0.7)
HCT: 37.1 % (ref 36.0–46.0)
Hemoglobin: 12.5 g/dL (ref 12.0–15.0)
LYMPHS ABS: 0.9 10*3/uL (ref 0.7–4.0)
Lymphocytes Relative: 9 % — ABNORMAL LOW (ref 12–46)
MCH: 31 pg (ref 26.0–34.0)
MCHC: 33.7 g/dL (ref 30.0–36.0)
MCV: 92.1 fL (ref 78.0–100.0)
MONO ABS: 0.6 10*3/uL (ref 0.1–1.0)
MONOS PCT: 6 % (ref 3–12)
NEUTROS ABS: 7.9 10*3/uL — AB (ref 1.7–7.7)
Neutrophils Relative %: 85 % — ABNORMAL HIGH (ref 43–77)
PLATELETS: 140 10*3/uL — AB (ref 150–400)
RBC: 4.03 MIL/uL (ref 3.87–5.11)
RDW: 13.4 % (ref 11.5–15.5)
WBC: 9.3 10*3/uL (ref 4.0–10.5)

## 2014-10-31 LAB — BLOOD GAS, VENOUS
Acid-Base Excess: 4.9 mmol/L — ABNORMAL HIGH (ref 0.0–2.0)
BICARBONATE: 29.2 meq/L — AB (ref 20.0–24.0)
DRAWN BY: 141211
O2 SAT: 35.3 %
Patient temperature: 98.6
TCO2: 30.6 mmol/L (ref 0–100)
pCO2, Ven: 45.7 mmHg (ref 45.0–50.0)
pH, Ven: 7.422 — ABNORMAL HIGH (ref 7.250–7.300)

## 2014-10-31 LAB — COMPREHENSIVE METABOLIC PANEL
ALK PHOS: 56 U/L (ref 39–117)
ALT: 20 U/L (ref 0–35)
ANION GAP: 11 (ref 5–15)
AST: 36 U/L (ref 0–37)
Albumin: 4 g/dL (ref 3.5–5.2)
BUN: 13 mg/dL (ref 6–23)
CO2: 26 mmol/L (ref 19–32)
Calcium: 9.3 mg/dL (ref 8.4–10.5)
Chloride: 101 mmol/L (ref 96–112)
Creatinine, Ser: 0.66 mg/dL (ref 0.50–1.10)
GFR calc Af Amer: >90 mL/min (ref >90)
GFR calc non Af Amer: 78 mL/min — ABNORMAL LOW (ref >90)
GLUCOSE: 115 mg/dL — AB (ref 70–99)
POTASSIUM: 3.3 mmol/L — AB (ref 3.5–5.1)
Sodium: 138 mmol/L (ref 135–145)
Total Bilirubin: 1 mg/dL (ref 0.3–1.2)
Total Protein: 7.7 g/dL (ref 6.0–8.3)

## 2014-10-31 LAB — TSH: TSH: 5.299 u[IU]/mL — ABNORMAL HIGH (ref 0.350–4.500)

## 2014-10-31 LAB — AMMONIA: Ammonia: 66 umol/L — ABNORMAL HIGH (ref 11–32)

## 2014-10-31 MED ORDER — HYDRALAZINE HCL 20 MG/ML IJ SOLN: 10.0000 mg | INTRAMUSCULAR | Status: DC | PRN

## 2014-10-31 MED ORDER — POTASSIUM CHLORIDE 10 MEQ/100ML IV SOLN
10.0000 meq | INTRAVENOUS | Status: AC
  Administered 2014-10-31 (×4): 10 meq via INTRAVENOUS
  Filled 2014-10-31 (×4): qty 100

## 2014-10-31 MED ORDER — LEVOTHYROXINE SODIUM 100 MCG IV SOLR
62.5000 ug | Freq: Every day | INTRAVENOUS | Status: DC
  Administered 2014-10-31 – 2014-11-02 (×3): 62.5 ug via INTRAVENOUS
  Filled 2014-10-31 (×3): qty 5

## 2014-10-31 MED ORDER — MORPHINE SULFATE 2 MG/ML IJ SOLN
2.0000 mg | Freq: Four times a day (QID) | INTRAMUSCULAR | Status: DC | PRN
  Administered 2014-10-31 – 2014-11-01 (×3): 2 mg via INTRAVENOUS
  Filled 2014-10-31 (×3): qty 1

## 2014-10-31 NOTE — Progress Notes (Signed)
[facility]                                                                              Patient Demographics  [name] Briggs, is a 81 y.o. female, DOB - November 05, 1926, HCW:[id number]  Admit date - 10/30/2014   Admitting Physician Verlee Monte, MD  Outpatient Primary MD for the patient is MORAITIS, Farley Ly, PA-C  LOS -    Chief Complaint  Patient presents with  . Fall      Interim history 81 year old with history of hypertension, Alzheimer's dementia, hypothyroidism who resides at a nursing facility was found after sustaining a fall. Fall was not witnessed. The patient was brought to emergency department and was found to have a negative CT of the head and maxillofacial areas however positive C1 fracture of cervical spine. Patient also has a right frontal hematoma. Neurosurgery consulted by the emergency department stating nonsurgical, with outpatient follow-up.  Assessment & Plan   Acute encephalopathy/underlying dementia -Encephalopathy likely complicated by underlying dementia -Per H&P, patient's mental status is the usually active and patient is able to ambulate -She was given a dose of lorazepam which may be worsening her drowsiness -Will continue to monitor telemetry -TSH 5.299, currently on synthroid -UA negative for infection -CT head: no evidence of hemorrhage or contusion -Patient currently afebrile with minor leukocytosis of 11.1 -Will continue to monitor, obtain CXR, ammonia level  Hypothyroidism -TSH 5.299, free T4 pending -Will continue Synthroid IV (patient currently NPO) -however increase the dose slightly  C1 fracture -Neurosurgery was contacted by emergency room physician, neurosurgery recommended no surgical intervention at this time, outpatient follow-up -Continue neck brace  Leukocytosis -Likely reactive -UA negative, pending chest xray -No antibiotics for the time being.  Will obtain blood cultures if WBC continue to rise.   Hematoma -Noted on  CT scan, secondary to fall  -no active bleeding -will continue to monitor H&H  Fall -Unclear etiology at this time as patient's fall was unwitnessed- possibly due to polypharmacy -CT of the head conducted twice showed no abnormalities -X-ray of the knees also show no fractures or abnormalities -Patient spontaneously moving all extremities -PT, OT will be consulted  Hypertension -Continue hydralazine when necessary -Will restart losartan/HCTZ once patient is able to pass her swallow eval  Code Status: DNR  Family Communication: None at bedside.   Disposition Plan: Admitted  Time Spent in minutes   30 minutes  Procedures  None  Consults   Neurosurgery, via phone by EDP  DVT Prophylaxis  SCDs  Lab Results  Component Value Date   PLT 119* 10/30/2014    Medications  Scheduled Meds: . levothyroxine  125 mcg Oral QAC breakfast  . OLANZapine  2.5 mg Oral QHS  . sodium chloride  3 mL Intravenous Q12H  . ursodiol  300 mg Oral TID   Continuous Infusions:  PRN Meds:.acetaminophen **OR** acetaminophen, hydrALAZINE, ondansetron **OR** ondansetron (ZOFRAN) IV  Antibiotics    Anti-infectives    None        Subjective:   [name] Briggs seen and examined today.  Patient currently resting.  Does not respond to voice, does respond to tactile stimulation. Currently in neck brace.   Objective:  Filed Vitals:   10/30/14 2127 10/30/14 2302 10/31/14 0159 10/31/14 0603  BP: 180/92 148/101 161/77 146/113  Pulse: 74 117 69 65  Temp:  98.6 F (37 C) 97.5 F (36.4 C) 97.6 F (36.4 C)  TempSrc:  Axillary Axillary Axillary  Resp:  16 16 16   Weight:  49.215 kg (108 lb 8 oz)    SpO2: 100% 98% 99% 100%    Wt Readings from Last 3 Encounters:  10/30/14 49.215 kg (108 lb 8 oz)  03/23/14 54.432 kg (120 lb)  03/26/13 54.9 kg (121 lb 0.5 oz)    No intake or output data in the 24 hours ending 10/31/14 0857  Exam  General: Well developed, NAD  HEENT:  Frontal hematoma,  facial bruising, mucous membranes dry  Cardiovascular: S1 S2 auscultated, RRR  Respiratory: Decreased but Clear to auscultation   Abdomen: Soft, nontender, nondistended, + bowel sounds  Extremities: warm dry without cyanosis clubbing or edema  Neuro: Unable to fully assess, patient moves extremities spontaneously  Data Review   Micro Results No results found for this or any previous visit (from the past 240 hour(s)).  Radiology Reports Ct Head Wo Contrast  10/31/2014   CLINICAL DATA:  [place]  EXAM: CT HEAD WITHOUT CONTRAST  TECHNIQUE: Contiguous axial images were obtained from the base of the skull through the vertex without intravenous contrast.  COMPARISON:  10/30/2014  FINDINGS: There is no intracranial hemorrhage or extra-axial fluid collection. There is moderately severe generalized atrophy. There is no evidence of acute infarction. There is no mass effect or midline shift. Basal cisterns remain patent. No interval change is evident from the scan obtained yesterday. Right frontal scalp hematoma again evident.  The C1 fractures and left occipital condyle fracture are again evident, incompletely imaged on this study.  IMPRESSION: No evidence of hemorrhage, contusion or other evolving intracranial traumatic injury. Generalized atrophy. Right frontal scalp hematoma. Known fractures at C1 and left occipital condyle, incompletely imaged.   Electronically Signed   By: Andreas Newport M.D.   On: 10/31/2014 00:51   Ct Head Wo Contrast  10/30/2014   CLINICAL DATA:  Fall, scalp laceration over the frontal region, patient cannot give history  EXAM: CT HEAD WITHOUT CONTRAST  CT CERVICAL SPINE WITHOUT CONTRAST  TECHNIQUE: Multidetector CT imaging of the head and cervical spine was performed following the standard protocol without intravenous contrast. Multiplanar CT image reconstructions of the cervical spine were also generated.  COMPARISON:  07/11/2014  FINDINGS: CT HEAD FINDINGS  Large frontal scalp  laceration/hematoma. No skull fracture. Prominent nutrient foramen left orbital roof stable from 07/11/2014. Motion artifact severely degrades this study. There is diffuse atrophy with no abnormal attenuation to suggest infarct mass hemorrhage or extra-axial fluid. Proportional dilatation of the ventricles noted.  CT CERVICAL SPINE FINDINGS  There are fractures of the anterior and posterior aspects of the C1 ring, both mildly displaced with comminution involving the posterior right C1 fracture. There is a mildly displaced fracture involving the left occipital condyle.  There is reversed lordosis. There is severe C4-5, C5-6, and C6-7 degenerative disc disease. There are no other fractures.  IMPRESSION: 1. No acute intracranial abnormality. Study moderately limited by motion. 2. Acute fractures of C1 and left occipital condyle. Critical Value/emergent results were called by telephone at the time of interpretation on 10/30/2014 at 8:39 am to Dr. Thayer Jew , who verbally acknowledged these results.   Electronically Signed   By: Skipper Cliche M.D.   On: 10/30/2014 08:39  Ct Cervical Spine Wo Contrast  10/30/2014   CLINICAL DATA:  Fall, scalp laceration over the frontal region, patient cannot give history  EXAM: CT HEAD WITHOUT CONTRAST  CT CERVICAL SPINE WITHOUT CONTRAST  TECHNIQUE: Multidetector CT imaging of the head and cervical spine was performed following the standard protocol without intravenous contrast. Multiplanar CT image reconstructions of the cervical spine were also generated.  COMPARISON:  07/11/2014  FINDINGS: CT HEAD FINDINGS  Large frontal scalp laceration/hematoma. No skull fracture. Prominent nutrient foramen left orbital roof stable from 07/11/2014. Motion artifact severely degrades this study. There is diffuse atrophy with no abnormal attenuation to suggest infarct mass hemorrhage or extra-axial fluid. Proportional dilatation of the ventricles noted.  CT CERVICAL SPINE FINDINGS  There are  fractures of the anterior and posterior aspects of the C1 ring, both mildly displaced with comminution involving the posterior right C1 fracture. There is a mildly displaced fracture involving the left occipital condyle.  There is reversed lordosis. There is severe C4-5, C5-6, and C6-7 degenerative disc disease. There are no other fractures.  IMPRESSION: 1. No acute intracranial abnormality. Study moderately limited by motion. 2. Acute fractures of C1 and left occipital condyle. Critical Value/emergent results were called by telephone at the time of interpretation on 10/30/2014 at 8:39 am to Dr. Thayer Jew , who verbally acknowledged these results.   Electronically Signed   By: Skipper Cliche M.D.   On: 10/30/2014 08:39   Dg Knee Complete 4 Views Left  10/30/2014   CLINICAL DATA:  Fall, left knee abrasions, trauma, pain  EXAM: LEFT KNEE - COMPLETE 4+ VIEW  COMPARISON:  None.  FINDINGS: Osteopenia noted. Atrophy of the left lower extremity musculature. Normal alignment without acute displaced fracture or joint effusion. Lateral view is limited with motion artifact. Peripheral atherosclerosis noted.  IMPRESSION: No acute osseous finding or displaced fracture.  Osteopenia   Electronically Signed   By: Jerilynn Mages.  Shick M.D.   On: 10/30/2014 10:36   Ct Maxillofacial Wo Cm  10/30/2014   CLINICAL DATA:  Acute fall with forehead and right eye injury. Initial encounter.  EXAM: CT MAXILLOFACIAL WITHOUT CONTRAST  TECHNIQUE: Multidetector CT imaging of the maxillofacial structures was performed. Multiplanar CT image reconstructions were also generated. A small metallic BB was placed on the right temple in order to reliably differentiate right from left.  COMPARISON:  10/30/2014, 07/11/2014 and prior CTs.  FINDINGS: No acute facial fracture identified.  Right preseptal facial soft tissue swelling is identified. The globes and orbits are unremarkable. There is no evidence of postseptal or intraconal abnormality.  The  paranasal sinuses, mastoid air cells and middle/ inner ears are clear.  C1 and left occipital condyle fractures again noted as seen on earlier exams today.  IMPRESSION: No evidence of acute facial fracture.  Right preseptal facial soft tissue swelling.   Electronically Signed   By: Margarette Canada M.D.   On: 10/30/2014 18:16    CBC  Recent Labs Lab 10/30/14 1530  WBC 11.1*  HGB 13.1  HCT 38.8  PLT 119*  MCV 91.7  MCH 31.0  MCHC 33.8  RDW 13.2  LYMPHSABS 0.6*  MONOABS 0.5  EOSABS 0.0  BASOSABS 0.0    Chemistries   Recent Labs Lab 10/30/14 1530 10/31/14 0620  NA 139 PENDING  K 3.4* PENDING  CL 100 PENDING  CO2 28 PENDING  GLUCOSE 130* PENDING  BUN 15 PENDING  CREATININE 0.68 PENDING  CALCIUM 10.1 PENDING  AST  --  36  ALT  --  20  ALKPHOS  --  52  BILITOT  --  1.0   ------------------------------------------------------------------------------------------------------------------ CrCl cannot be calculated (Unknown ideal weight.). ------------------------------------------------------------------------------------------------------------------ No results for input(s): HGBA1C in the last 72 hours. ------------------------------------------------------------------------------------------------------------------ No results for input(s): CHOL, HDL, LDLCALC, TRIG, CHOLHDL, LDLDIRECT in the last 72 hours. ------------------------------------------------------------------------------------------------------------------  Recent Labs  10/31/14 0045  TSH 5.299*   ------------------------------------------------------------------------------------------------------------------ No results for input(s): VITAMINB12, FOLATE, FERRITIN, TIBC, IRON, RETICCTPCT in the last 72 hours.  Coagulation profile No results for input(s): INR, PROTIME in the last 168 hours.  No results for input(s): DDIMER in the last 72 hours.  Cardiac Enzymes No results for input(s): CKMB, TROPONINI,  MYOGLOBIN in the last 168 hours.  Invalid input(s): CK ------------------------------------------------------------------------------------------------------------------ Invalid input(s): POCBNP    Vivaan Helseth D.O. on 10/31/2014 at 8:57 AM  Between 7am to 7pm - Pager - [phone]  After 7pm go to [email or web address] - password [email or web address]  And look for the night coverage person covering for me after hours  [facility] Office  [phone]

## 2014-10-31 NOTE — Progress Notes (Signed)
SLP Cancellation Note  Patient Details Name: [name] Briggs MRN: [id number] DOB: 04-07-1927   Cancelled treatment:       Reason Eval/Treat Not Completed: Fatigue/lethargy limiting ability to participate.  Pt not arousable for assessment.  Will follow.   Juan Quam Laurice 10/31/2014, 10:21 AM

## 2014-10-31 NOTE — Progress Notes (Signed)
UR completed 

## 2014-11-01 DIAGNOSIS — Z7982 Long term (current) use of aspirin: Secondary | ICD-10-CM | POA: Diagnosis not present

## 2014-11-01 DIAGNOSIS — Z888 Allergy status to other drugs, medicaments and biological substances status: Secondary | ICD-10-CM | POA: Diagnosis not present

## 2014-11-01 DIAGNOSIS — D72829 Elevated white blood cell count, unspecified: Secondary | ICD-10-CM | POA: Diagnosis present

## 2014-11-01 DIAGNOSIS — F329 Major depressive disorder, single episode, unspecified: Secondary | ICD-10-CM | POA: Diagnosis present

## 2014-11-01 DIAGNOSIS — D696 Thrombocytopenia, unspecified: Secondary | ICD-10-CM | POA: Diagnosis present

## 2014-11-01 DIAGNOSIS — W19XXXA Unspecified fall, initial encounter: Secondary | ICD-10-CM | POA: Diagnosis present

## 2014-11-01 DIAGNOSIS — S12000A Unspecified displaced fracture of first cervical vertebra, initial encounter for closed fracture: Secondary | ICD-10-CM | POA: Diagnosis not present

## 2014-11-01 DIAGNOSIS — Z87891 Personal history of nicotine dependence: Secondary | ICD-10-CM | POA: Diagnosis not present

## 2014-11-01 DIAGNOSIS — E032 Hypothyroidism due to medicaments and other exogenous substances: Secondary | ICD-10-CM | POA: Diagnosis not present

## 2014-11-01 DIAGNOSIS — Z66 Do not resuscitate: Secondary | ICD-10-CM | POA: Diagnosis present

## 2014-11-01 DIAGNOSIS — S12000D Unspecified displaced fracture of first cervical vertebra, subsequent encounter for fracture with routine healing: Secondary | ICD-10-CM | POA: Diagnosis not present

## 2014-11-01 DIAGNOSIS — Y92129 Unspecified place in nursing home as the place of occurrence of the external cause: Secondary | ICD-10-CM | POA: Diagnosis not present

## 2014-11-01 DIAGNOSIS — G309 Alzheimer's disease, unspecified: Secondary | ICD-10-CM | POA: Diagnosis not present

## 2014-11-01 DIAGNOSIS — M542 Cervicalgia: Secondary | ICD-10-CM | POA: Diagnosis not present

## 2014-11-01 DIAGNOSIS — Z853 Personal history of malignant neoplasm of breast: Secondary | ICD-10-CM | POA: Diagnosis not present

## 2014-11-01 DIAGNOSIS — E876 Hypokalemia: Secondary | ICD-10-CM | POA: Diagnosis not present

## 2014-11-01 DIAGNOSIS — Z9049 Acquired absence of other specified parts of digestive tract: Secondary | ICD-10-CM | POA: Diagnosis present

## 2014-11-01 DIAGNOSIS — F419 Anxiety disorder, unspecified: Secondary | ICD-10-CM | POA: Diagnosis present

## 2014-11-01 DIAGNOSIS — E43 Unspecified severe protein-calorie malnutrition: Secondary | ICD-10-CM | POA: Diagnosis present

## 2014-11-01 DIAGNOSIS — E039 Hypothyroidism, unspecified: Secondary | ICD-10-CM | POA: Diagnosis not present

## 2014-11-01 DIAGNOSIS — R627 Adult failure to thrive: Secondary | ICD-10-CM | POA: Diagnosis present

## 2014-11-01 DIAGNOSIS — Z515 Encounter for palliative care: Secondary | ICD-10-CM | POA: Diagnosis not present

## 2014-11-01 DIAGNOSIS — R296 Repeated falls: Secondary | ICD-10-CM | POA: Diagnosis present

## 2014-11-01 DIAGNOSIS — R531 Weakness: Secondary | ICD-10-CM | POA: Diagnosis present

## 2014-11-01 DIAGNOSIS — I10 Essential (primary) hypertension: Secondary | ICD-10-CM | POA: Diagnosis present

## 2014-11-01 DIAGNOSIS — S0003XA Contusion of scalp, initial encounter: Secondary | ICD-10-CM | POA: Diagnosis present

## 2014-11-01 DIAGNOSIS — S02113A Unspecified occipital condyle fracture, initial encounter for closed fracture: Secondary | ICD-10-CM | POA: Diagnosis present

## 2014-11-01 DIAGNOSIS — F0281 Dementia in other diseases classified elsewhere with behavioral disturbance: Secondary | ICD-10-CM | POA: Diagnosis present

## 2014-11-01 DIAGNOSIS — G4730 Sleep apnea, unspecified: Secondary | ICD-10-CM | POA: Diagnosis present

## 2014-11-01 DIAGNOSIS — Z681 Body mass index (BMI) 19 or less, adult: Secondary | ICD-10-CM | POA: Diagnosis not present

## 2014-11-01 DIAGNOSIS — G9340 Encephalopathy, unspecified: Secondary | ICD-10-CM | POA: Diagnosis present

## 2014-11-01 LAB — BASIC METABOLIC PANEL
ANION GAP: 8 (ref 5–15)
BUN: 15 mg/dL (ref 6–23)
CALCIUM: 8.7 mg/dL (ref 8.4–10.5)
CO2: 28 mmol/L (ref 19–32)
Chloride: 101 mmol/L (ref 96–112)
Creatinine, Ser: 0.64 mg/dL (ref 0.50–1.10)
GFR calc Af Amer: >90 mL/min (ref >90)
GFR, EST NON AFRICAN AMERICAN: 79 mL/min — AB (ref >90)
Glucose, Bld: 98 mg/dL (ref 70–99)
Potassium: 3.2 mmol/L — ABNORMAL LOW (ref 3.5–5.1)
SODIUM: 137 mmol/L (ref 135–145)

## 2014-11-01 LAB — MRSA PCR SCREENING: MRSA BY PCR: NEGATIVE

## 2014-11-01 LAB — CBC
HEMATOCRIT: 35.2 % — AB (ref 36.0–46.0)
HEMOGLOBIN: 12.2 g/dL (ref 12.0–15.0)
MCH: 31.6 pg (ref 26.0–34.0)
MCHC: 34.7 g/dL (ref 30.0–36.0)
MCV: 91.2 fL (ref 78.0–100.0)
Platelets: 128 10*3/uL — ABNORMAL LOW (ref 150–400)
RBC: 3.86 MIL/uL — AB (ref 3.87–5.11)
RDW: 13.4 % (ref 11.5–15.5)
WBC: 7.7 10*3/uL (ref 4.0–10.5)

## 2014-11-01 LAB — T4, FREE: Free T4: 0.97 ng/dL (ref 0.80–1.80)

## 2014-11-01 LAB — MAGNESIUM: MAGNESIUM: 1.8 mg/dL (ref 1.5–2.5)

## 2014-11-01 MED ORDER — ACETAMINOPHEN 650 MG RE SUPP
650.0000 mg | Freq: Four times a day (QID) | RECTAL | Status: DC
  Administered 2014-11-01 – 2014-11-02 (×4): 650 mg via RECTAL
  Filled 2014-11-01 (×4): qty 1

## 2014-11-01 MED ORDER — ACETAMINOPHEN 650 MG RE SUPP: 650.0000 mg | Freq: Two times a day (BID) | RECTAL | Status: DC | PRN

## 2014-11-01 MED ORDER — KCL IN DEXTROSE-NACL 20-5-0.9 MEQ/L-%-% IV SOLN
INTRAVENOUS | Status: DC
  Administered 2014-11-01: 15:00:00 via INTRAVENOUS
  Filled 2014-11-01: qty 1000

## 2014-11-01 MED ORDER — MORPHINE SULFATE 2 MG/ML IJ SOLN
1.0000 mg | Freq: Four times a day (QID) | INTRAMUSCULAR | Status: DC | PRN
  Administered 2014-11-01 – 2014-11-02 (×2): 1 mg via INTRAVENOUS
  Filled 2014-11-01 (×3): qty 1

## 2014-11-01 MED ORDER — POTASSIUM CHLORIDE 10 MEQ/100ML IV SOLN
10.0000 meq | INTRAVENOUS | Status: AC
  Administered 2014-11-01 (×4): 10 meq via INTRAVENOUS
  Filled 2014-11-01: qty 100

## 2014-11-01 NOTE — Evaluation (Signed)
SLP Cancellation Note  Patient Details Name: [name] Briggs MRN: [id number] DOB: Mar 28, 1927   Cancelled treatment:       Reason Eval/Treat Not Completed: Fatigue/lethargy limiting ability to participate  Pt remains inadequately alert for swallow evaluation.  She did not open her eyes and moaned incomprehensible speech output x1 with verbal/tactile stimulation.    Will continue to follow for readiness for evaluation.     Macario Golds 11/01/2014, 8:39 AM

## 2014-11-01 NOTE — Clinical Social Work Psychosocial (Signed)
Clinical Social Work Department BRIEF PSYCHOSOCIAL ASSESSMENT 11/01/2014  Patient:  [name] Briggs, [name] Briggs     Account Number:  [id number]     Admit date:  10/30/2014  Clinical Social Worker:  Glendon Axe, CLINICAL SOCIAL WORKER  Date/Time:  11/01/2014 12:16 PM  Referred by:  RN  Date Referred:  11/01/2014 Referred for  ALF Placement   Other Referral:   [facility] placement   Interview type:  Other - See comment Other interview type:   CSW met with pt's son, [name] Briggs, [name] Briggs (facility admissions coordinator of [facility]) and [name] Briggs (pt's private RN).    PSYCHOSOCIAL DATA Living Status:  FACILITY Admitted from facility:  [facility]  Level of care:  Assisted Living Primary support name:  [name] Briggs Primary support relationship to patient:  CHILD, ADULT Degree of support available:   Strong    CURRENT CONCERNS Current Concerns  Post-Acute Placement   Other Concerns:    SOCIAL WORK ASSESSMENT / PLAN Clinical Social Worker met with patient and pt's son, [name] Briggs present at bedside in reference to post-acute placement for a family care home. CSW introduced CSW role. Pt's son reported pt is a resident of [facility] in [place] however facility is unable to accept pt back unless pt's family provides a Charity fundraiser. Pt's son reported he is not willing to provide private sitter and would like patient moved into [facility] (family care home for seniors with memory loss) in [place]. [facility] admissons coordinator, [name] Briggs present with pt's son at bedside and requested FL-2 and additional admissions clinicals. Pt's private RN, [name] Briggs present and asked several questions in reference to discharge needs/ medications. CSW advised private RN to speak with pt's attending/ RN. CSW will continue to follow pt and pt's family for continued support and to facilitate pt's discharge once medically stable.     Assessment/plan status:  Psychosocial Support/Ongoing Assessment of Needs Other assessment/ plan:   FL-2 on cart for MD signature.   Information/referral to community resources:   [facility].    PATIENT'S/FAMILY'S RESPONSE TO PLAN OF CARE: Pt lying in bed alseep and disorientedX4. Pt's son present at bedside and agreeable with pt transitioning to family care home in [place]. Pt's son does not want pt to return to [facility] because facility cannot offer a higher level of care. Pt's son pleasant and appreciated social work intervention. CSW remains available.     Glendon Axe, MSW, LCSWA [phone] 11/01/2014 12:44 PM

## 2014-11-01 NOTE — Progress Notes (Signed)
PT Cancellation Note  Patient Details Name: [name] Briggs MRN: [id number] DOB: 03-29-1927   Cancelled Treatment:    Reason Eval/Treat Not Completed: Fatigue/lethargy limiting ability to participate. RN reports gave pt 1 mg morphine earlier this afternoon. Pt soundly sleeping. Did not arouse with PROM x 4 extremities or cold washcloth to face. Pt repositioned for comfort and prevent pressure to bony prominences.  Noted pt with h/o severe dementia and falls. Will attempt evaluation 11/02/14.   Seraphina Mitchner 11/01/2014, 4:28 PM Pager [phone]

## 2014-11-01 NOTE — Clinical Social Work Note (Signed)
Clinical Social Worker has assessed patient and pt's family present at bedside. Full psychosocial assessment to follow.   Glendon Axe, MSW, [place] [phone] 11/01/2014 10:28 AM

## 2014-11-01 NOTE — Progress Notes (Signed)
Son and Kathy(coordinator)[facility] requested to see MD this AM. MD paged and aware. MD will be here shortly to see speak with them.    Ave Filter, RN

## 2014-11-01 NOTE — Progress Notes (Signed)
OT Cancellation Note  Patient Details Name: [name] Briggs MRN: [id number] DOB: 02/07/1927   Cancelled Treatment:    Reason Eval/Treat Not Completed: Pain limiting ability to participate;Medical issues which prohibited therapy. Per RN, K+ correction initiation, but patient with increased pain and unable to tolerate bed mobility at this time. Will follow-up and perform OT eval/treat as able and appropriate.   Ahmir Bracken , MS, OTR/L, CLT Pager: [phone]  11/01/2014, 3:09 PM

## 2014-11-01 NOTE — Progress Notes (Addendum)
[facility]                                                                              Patient Demographics  [name] Briggs, is a 81 y.o. female, DOB - 07-17-1927, IOX:[id number]  Admit date - 10/30/2014   Admitting Physician Verlee Monte, MD  Outpatient Primary MD for the patient is MORAITIS, Farley Ly, PA-C  LOS -    Chief Complaint  Patient presents with  . Fall      Interim history 81 year old with history of hypertension, advanced Alzheimer's dementia, hypothyroidism who resides at an ALF was found after sustaining a fall. Fall was not witnessed. The patient was brought to emergency department where she had negative CT of the head and maxillofacial areas however positive C1 fracture of cervical spine. Patient also has a right frontal hematoma. Neurosurgery (Dr. Christella Noa) consulted by the EDP and was recommended cervical collar placement, nonsurgical management and outpatient follow-up in 2 weeks.  Assessment & Plan   Acute encephalopathy/underlying dementia - Patient has advanced dementia as per discussion with her son [name] Briggs. Current altered mental status may be secondary to opioid pain medications complicating underlying dementia. - At baseline, she apparently is quite confused and cannot respond appropriately to questions or instructions, however ambulates without assistance. -TSH 5.299, currently on synthroid -UA & chest x-ray negative for infection -CT head: no evidence of hemorrhage or contusion - Ammonia level mildly elevated at 66. However with normal LFTs, unclear significance and do not believe that she has hepatic encephalopathy. - We will place on scheduled rectal Tylenol for pain control and minimize opioids. May consider lidocaine patch if pain persists. Monitor mental status. - Speech therapy unable to evaluate swallowing secondary to sedation. Hence nothing by mouth.  Hypothyroidism -TSH 5.299, free T4: 5.299 -Will continue Synthroid IV (patient currently  NPO) - Recommend repeating TFTs in 4-6 weeks.  C1 & left occipital Condylar fracture - Neurosurgery (Dr. Christella Noa) consulted by the EDP and was recommended cervical collar placement, nonsurgical management and outpatient follow-up in 2 weeks. - Discussed with Dr. Christella Noa: will see her in the hospital and indicated that she will need the C collar for 2-3 months.  Leukocytosis -Likely reactive -UA negative, chest x-ray negative. - Resolved.   Hematoma -Noted on CT scan, secondary to fall  -no active bleeding - Hemoglobin stable.   Fall -Unclear etiology at this time as patient's fall was unwitnessed- possibly due to polypharmacy -CT of the head conducted twice showed no abnormalities -X-ray of the knees also show no fractures or abnormalities -Patient spontaneously moving all extremities -PT, OT will be consulted-unable to evaluate until improvement in mental status/sedation.    Hypertension -Continue hydralazine when necessary -Will restart losartan/HCTZ once patient is able to pass her swallow eval - Mildly uncontrolled and fluctuating.   Hypokalemia - Replace IV and follow BMP  DO NOT RESUSCITATE/failure to thrive - As per son, patient was on hospice until 8-9 months ago. Patient is likely to rapidly decline from current C1 fracture and limited mobility. Suggested palliative care consultation for goals of care and he was agreeable to same.  Chronic Thrombocytopenia - stable.   Code Status: DNR Family Communication: Discussed with  patient's son Mr. [name] Briggs via facetime. Disposition Plan:  To be determined.  Time Spent in minutes   30 minutes  Procedures  None  Consults   Neurosurgery, via phone by EDP  DVT Prophylaxis  SCDs  Lab Results  Component Value Date   PLT 128* 11/01/2014    Medications  Scheduled Meds: . acetaminophen  650 mg Rectal QID  . levothyroxine  62.5 mcg Intravenous Daily  . potassium chloride  10 mEq Intravenous Q1 Hr x 4  . sodium  chloride  3 mL Intravenous Q12H   Continuous Infusions: . dextrose 5 % and 0.9 % NaCl with KCl 20 mEq/L 50 mL/hr at 11/01/14 1448   PRN Meds:.[DISCONTINUED] acetaminophen **OR** acetaminophen, hydrALAZINE, morphine injection, [DISCONTINUED] ondansetron **OR** ondansetron (ZOFRAN) IV  Antibiotics    Anti-infectives    None        Subjective:   [name] Briggs seen and examined today.  Patient somnolent and nonverbal. Received a dose of morphine early this morning.   Objective:   Filed Vitals:   11/01/14 0500 11/01/14 0643 11/01/14 1032 11/01/14 1400  BP:  168/71 143/67 155/96  Pulse:   54 69  Temp:  98.3 F (36.8 C) 97.9 F (36.6 C) 97.8 F (36.6 C)  TempSrc:  Oral Axillary Oral  Resp:  18 18 20   Weight: 52.2 kg (115 lb 1.3 oz)     SpO2:  96% 100% 97%    Wt Readings from Last 3 Encounters:  11/01/14 52.2 kg (115 lb 1.3 oz)  03/23/14 54.432 kg (120 lb)  03/26/13 54.9 kg (121 lb 0.5 oz)     Intake/Output Summary (Last 24 hours) at 11/01/14 1546 Last data filed at 11/01/14 1351  Gross per 24 hour  Intake    180 ml  Output      0 ml  Net    180 ml    Exam  General: frail elderly female lying comfortably propped up in bed.  HEENT:  Frontal hematoma, facial bruising/right periorbital ecchymosis. Cervical collar in place.  Cardiovascular: S1 S2 auscultated, RRR. No murmurs or pedal edema. Telemetry: SB 50's-SR   Respiratory: Decreased inspiratory effort but seems clear to auscultation.  Abdomen: Soft, nontender, nondistended, + bowel sounds  Extremities: warm dry without cyanosis clubbing or edema. Spontaneously moving all extremities. Peripheral pulses symmetrically felt.  Neuro: drowsy but mumbles incomprehensively on repeated questioning. Does not follow instructions. Not oriented. No focal neurological deficits.   Data Review   Micro Results No results found for this or any previous visit (from the past 240 hour(s)).  Radiology Reports Ct Head Wo  Contrast  10/31/2014   CLINICAL DATA:  [place]  EXAM: CT HEAD WITHOUT CONTRAST  TECHNIQUE: Contiguous axial images were obtained from the base of the skull through the vertex without intravenous contrast.  COMPARISON:  10/30/2014  FINDINGS: There is no intracranial hemorrhage or extra-axial fluid collection. There is moderately severe generalized atrophy. There is no evidence of acute infarction. There is no mass effect or midline shift. Basal cisterns remain patent. No interval change is evident from the scan obtained yesterday. Right frontal scalp hematoma again evident.  The C1 fractures and left occipital condyle fracture are again evident, incompletely imaged on this study.  IMPRESSION: No evidence of hemorrhage, contusion or other evolving intracranial traumatic injury. Generalized atrophy. Right frontal scalp hematoma. Known fractures at C1 and left occipital condyle, incompletely imaged.   Electronically Signed   By: Andreas Newport M.D.   On: 10/31/2014 00:51  Ct Head Wo Contrast  10/30/2014   CLINICAL DATA:  Fall, scalp laceration over the frontal region, patient cannot give history  EXAM: CT HEAD WITHOUT CONTRAST  CT CERVICAL SPINE WITHOUT CONTRAST  TECHNIQUE: Multidetector CT imaging of the head and cervical spine was performed following the standard protocol without intravenous contrast. Multiplanar CT image reconstructions of the cervical spine were also generated.  COMPARISON:  07/11/2014  FINDINGS: CT HEAD FINDINGS  Large frontal scalp laceration/hematoma. No skull fracture. Prominent nutrient foramen left orbital roof stable from 07/11/2014. Motion artifact severely degrades this study. There is diffuse atrophy with no abnormal attenuation to suggest infarct mass hemorrhage or extra-axial fluid. Proportional dilatation of the ventricles noted.  CT CERVICAL SPINE FINDINGS  There are fractures of the anterior and posterior aspects of the C1 ring, both mildly displaced with comminution involving  the posterior right C1 fracture. There is a mildly displaced fracture involving the left occipital condyle.  There is reversed lordosis. There is severe C4-5, C5-6, and C6-7 degenerative disc disease. There are no other fractures.  IMPRESSION: 1. No acute intracranial abnormality. Study moderately limited by motion. 2. Acute fractures of C1 and left occipital condyle. Critical Value/emergent results were called by telephone at the time of interpretation on 10/30/2014 at 8:39 am to Dr. Thayer Jew , who verbally acknowledged these results.   Electronically Signed   By: Skipper Cliche M.D.   On: 10/30/2014 08:39   Ct Cervical Spine Wo Contrast  10/30/2014   CLINICAL DATA:  Fall, scalp laceration over the frontal region, patient cannot give history  EXAM: CT HEAD WITHOUT CONTRAST  CT CERVICAL SPINE WITHOUT CONTRAST  TECHNIQUE: Multidetector CT imaging of the head and cervical spine was performed following the standard protocol without intravenous contrast. Multiplanar CT image reconstructions of the cervical spine were also generated.  COMPARISON:  07/11/2014  FINDINGS: CT HEAD FINDINGS  Large frontal scalp laceration/hematoma. No skull fracture. Prominent nutrient foramen left orbital roof stable from 07/11/2014. Motion artifact severely degrades this study. There is diffuse atrophy with no abnormal attenuation to suggest infarct mass hemorrhage or extra-axial fluid. Proportional dilatation of the ventricles noted.  CT CERVICAL SPINE FINDINGS  There are fractures of the anterior and posterior aspects of the C1 ring, both mildly displaced with comminution involving the posterior right C1 fracture. There is a mildly displaced fracture involving the left occipital condyle.  There is reversed lordosis. There is severe C4-5, C5-6, and C6-7 degenerative disc disease. There are no other fractures.  IMPRESSION: 1. No acute intracranial abnormality. Study moderately limited by motion. 2. Acute fractures of C1 and left  occipital condyle. Critical Value/emergent results were called by telephone at the time of interpretation on 10/30/2014 at 8:39 am to Dr. Thayer Jew , who verbally acknowledged these results.   Electronically Signed   By: Skipper Cliche M.D.   On: 10/30/2014 08:39   Dg Chest Port 1 View  10/31/2014   CLINICAL DATA:  Acute encephalopathy. History of hypertension. History of breast carcinoma.  EXAM: PORTABLE CHEST - 1 VIEW  COMPARISON:  03/23/2014  FINDINGS: Cardiac silhouette mildly enlarged.  No mediastinal or hilar masses.  No lung consolidation or edema. No pleural effusion or pneumothorax.  Stable changes from left breast surgery.  Bony thorax is demineralized but grossly intact.  IMPRESSION: No acute cardiopulmonary disease.   Electronically Signed   By: Lajean Manes M.D.   On: 10/31/2014 12:17   Dg Knee Complete 4 Views Left  10/30/2014   CLINICAL  DATA:  Fall, left knee abrasions, trauma, pain  EXAM: LEFT KNEE - COMPLETE 4+ VIEW  COMPARISON:  None.  FINDINGS: Osteopenia noted. Atrophy of the left lower extremity musculature. Normal alignment without acute displaced fracture or joint effusion. Lateral view is limited with motion artifact. Peripheral atherosclerosis noted.  IMPRESSION: No acute osseous finding or displaced fracture.  Osteopenia   Electronically Signed   By: Jerilynn Mages.  Shick M.D.   On: 10/30/2014 10:36   Ct Maxillofacial Wo Cm  10/30/2014   CLINICAL DATA:  Acute fall with forehead and right eye injury. Initial encounter.  EXAM: CT MAXILLOFACIAL WITHOUT CONTRAST  TECHNIQUE: Multidetector CT imaging of the maxillofacial structures was performed. Multiplanar CT image reconstructions were also generated. A small metallic BB was placed on the right temple in order to reliably differentiate right from left.  COMPARISON:  10/30/2014, 07/11/2014 and prior CTs.  FINDINGS: No acute facial fracture identified.  Right preseptal facial soft tissue swelling is identified. The globes and orbits are  unremarkable. There is no evidence of postseptal or intraconal abnormality.  The paranasal sinuses, mastoid air cells and middle/ inner ears are clear.  C1 and left occipital condyle fractures again noted as seen on earlier exams today.  IMPRESSION: No evidence of acute facial fracture.  Right preseptal facial soft tissue swelling.   Electronically Signed   By: Margarette Canada M.D.   On: 10/30/2014 18:16    CBC  Recent Labs Lab 10/30/14 1530 10/31/14 0845 11/01/14 0750  WBC 11.1* 9.3 7.7  HGB 13.1 12.5 12.2  HCT 38.8 37.1 35.2*  PLT 119* 140* 128*  MCV 91.7 92.1 91.2  MCH 31.0 31.0 31.6  MCHC 33.8 33.7 34.7  RDW 13.2 13.4 13.4  LYMPHSABS 0.6* 0.9  --   MONOABS 0.5 0.6  --   EOSABS 0.0 0.0  --   BASOSABS 0.0 0.0  --     Chemistries   Recent Labs Lab 10/30/14 1530 10/31/14 0620 11/01/14 0750 11/01/14 0817  NA 139 138 137  --   K 3.4* 3.3* 3.2*  --   CL 100 101 101  --   CO2 28 26 28   --   GLUCOSE 130* 115* 98  --   BUN 15 13 15   --   CREATININE 0.68 0.66 0.64  --   CALCIUM 10.1 9.3 8.7  --   MG  --   --   --  1.8  AST  --  36  --   --   ALT  --  20  --   --   ALKPHOS  --  56  --   --   BILITOT  --  1.0  --   --    ------------------------------------------------------------------------------------------------------------------ CrCl cannot be calculated (Unknown ideal weight.). ------------------------------------------------------------------------------------------------------------------ No results for input(s): HGBA1C in the last 72 hours. ------------------------------------------------------------------------------------------------------------------ No results for input(s): CHOL, HDL, LDLCALC, TRIG, CHOLHDL, LDLDIRECT in the last 72 hours. ------------------------------------------------------------------------------------------------------------------  Recent Labs  10/31/14 0045  TSH 5.299*    ------------------------------------------------------------------------------------------------------------------ No results for input(s): VITAMINB12, FOLATE, FERRITIN, TIBC, IRON, RETICCTPCT in the last 72 hours.  Coagulation profile No results for input(s): INR, PROTIME in the last 168 hours.  No results for input(s): DDIMER in the last 72 hours.  Cardiac Enzymes No results for input(s): CKMB, TROPONINI, MYOGLOBIN in the last 168 hours.  Invalid input(s): CK ------------------------------------------------------------------------------------------------------------------ Invalid input(s): POCBNP  Time spent: 40 minutes.  Vernell Leep, MD, FACP, FHM. [facility] Pager [phone]  If 7PM-7AM, please contact night-coverage [email or web address] Password  [email or web address] 11/01/2014, 4:03 PM

## 2014-11-01 NOTE — Progress Notes (Signed)
INITIAL NUTRITION ASSESSMENT  DOCUMENTATION CODES Per approved criteria  -Severe malnutrition in the context of chronic illness -Underweight  Pt meets criteria for SEVERE MALNUTRITION in the context of CHRONIC ILLNESS as evidenced by severe muscle wasting and severe loss of subcutaneous fat mass.  INTERVENTION: Diet advancement per MD/SLP  Add Ensure Enlive po BID when diet is advanced, each supplement provides 350 kcal and 20 grams of protein  Provide Magic Cup ice cream with meal trays when diet is advanced, each supplement provides 290 kcal and 9 grams of protein  RD to continue to monitor   NUTRITION DIAGNOSIS: Malnutrition related to dementia, advanced age, and poor PO intake as evidenced by severe muscle and fat wasting per physical exam.   Goal: Pt to meet >/= 90% of their estimated nutrition needs   Monitor:  Diet advancement, PO intake, weight trend, labs  Reason for Assessment: Low Braden Score  81 y.o. female  Admitting Dx: Acute encephalopathy  ASSESSMENT: 81 y.o. female with Past medical history of hypertension, Alzheimer's dementia, hypothyroidism, recurrent fall, depression.  Pt asleep at time of visit; remains NPO. Per family patient wad a pureed diet PTA due to having no teeth and needing assistance with feeding. Family reports that patient would likely be able to tolerate a Mechanical soft (dysphagia 3) diet but, the SNF put her on pureed foods as a precaution. Pt eats poorly and needs encouragement and assistance with eating. They report that patient used to weight between 160 and 200 lbs but, pt has been gradually losing weight over the past few years. Per chart, pt was given Ensure shake once daily with dinner.   Labs: low potassium   Nutrition Focused Physical Exam:  Subcutaneous Fat:  Orbital Region: moderate wasting Upper Arm Region: severe wasting Thoracic and Lumbar Region: NA  Muscle:  Temple Region: mild wasting Clavicle Bone Region: severe  wasting Clavicle and Acromion Bone Region: severe wasting Scapular Bone Region: NA Dorsal Hand: severe wasting Patellar Region: severe wasting Anterior Thigh Region: severe wasting Posterior Calf Region: NA  Edema: none noted   Height: Ht Readings from Last 1 Encounters:  03/26/13 5\' 7"  (1.702 m)  Patient is 5'9" per family report  Weight: Wt Readings from Last 1 Encounters:  11/01/14 115 lb 1.3 oz (52.2 kg)    Ideal Body Weight: 135 lbs  % Ideal Body Weight: 85%  Wt Readings from Last 10 Encounters:  11/01/14 115 lb 1.3 oz (52.2 kg)  03/23/14 120 lb (54.432 kg)  03/26/13 121 lb 0.5 oz (54.9 kg)  12/21/10 153 lb 12.8 oz (69.763 kg)  06/10/08 186 lb 2.1 oz (84.429 kg)  05/21/08 188 lb 2.1 oz (85.336 kg)  04/14/08 188 lb 8 oz (85.503 kg)    Usual Body Weight: 160 lbs  % Usual Body Weight: 72%  BMI:  Body mass index is 18.02 kg/(m^2). (Underweight)  Estimated Nutritional Needs: Kcal: 1600-1800 Protein: 65-75 grams Fluid: 1.6 L/day  Skin: laceration on head  Diet Order: Diet NPO time specified Except for: Sips with Meds, Ice Chips  EDUCATION NEEDS: -No education needs identified at this time   Intake/Output Summary (Last 24 hours) at 11/01/14 1401 Last data filed at 11/01/14 0800  Gross per 24 hour  Intake      0 ml  Output      0 ml  Net      0 ml    Last BM: PTA   Labs:   Recent Labs Lab 10/30/14 1530 10/31/14 0620 11/01/14  0750 11/01/14 0817  NA 139 138 137  --   K 3.4* 3.3* 3.2*  --   CL 100 101 101  --   CO2 28 26 28   --   BUN 15 13 15   --   CREATININE 0.68 0.66 0.64  --   CALCIUM 10.1 9.3 8.7  --   MG  --   --   --  1.8  GLUCOSE 130* 115* 98  --     CBG (last 3)   Recent Labs  10/30/14 1656  GLUCAP 112*    Scheduled Meds: . acetaminophen  650 mg Rectal QID  . levothyroxine  62.5 mcg Intravenous Daily  . potassium chloride  10 mEq Intravenous Q1 Hr x 4  . sodium chloride  3 mL Intravenous Q12H    Continuous  Infusions: . dextrose 5 % and 0.9 % NaCl with KCl 20 mEq/L      Past Medical History  Diagnosis Date  . Hypertension   . Alzheimer disease   . Depression   . Breast cancer   . Sleep apnea   . Hypothyroidism   . Anxiety   . Syncope and collapse   . Sinus bradycardia   . UTI (urinary tract infection)     ACUTE  . Dehydration     ACUTE  . Bradycardia     Past Surgical History  Procedure Laterality Date  . Cholecystectomy    . Breast surgery      bilteral mastectomy's    Pryor Ochoa RD, LDN Inpatient Clinical Dietitian Pager: [phone] After Hours Pager: [phone]

## 2014-11-01 NOTE — Progress Notes (Addendum)
Upon rounding, patient is awake and alert at this time noted sitting upright in bed with bil legs dangling on the bed rails. She also verbalized that her neck is hurting with facial grimaces and crying. PRN pain med given. She also picking at her IV site with K+ IVP running along with IVF  K+ IVP running at 50cc/hr d/t c/o pain at IV site. Patient appears tolerable with rate at this time. Will continue to monitor.  Ave Filter, RN

## 2014-11-01 NOTE — Progress Notes (Signed)
Paged  MD of K+ Level. Patient received 4 runs of K+ IVP previous day.    Ave Filter, RN

## 2014-11-01 NOTE — Progress Notes (Signed)
UR completed 

## 2014-11-01 NOTE — Progress Notes (Signed)
PT Cancellation Note  Patient Details Name: MCKENSEY BERGHUIS MRN: [id number] DOB: [birth year]   Cancelled Treatment:    Reason Eval/Treat Not Completed: Fatigue/lethargy limiting ability to participate. RN reports pt received pain medicine and is very lethargic. Also noted K+ 3.2 with no repletion ordered at this time.   Iasia Forcier 11/01/2014, 9:33 AM Pager [phone]

## 2014-11-01 NOTE — Consult Note (Signed)
BP 154/73 mmHg  Pulse 61  Temp(Src) 98.2 F (36.8 C) (Axillary)  Resp 17  Wt 52.2 kg (115 lb 1.3 oz)  SpO2 98% Cc: C1 fracture, dementia [name] Briggs is a 81 y.o. female Whom fell on 3/19 sustaining a C1 fracture and left occipital condyle fracture without neurologic injury. She has fallen multiple times, and was found down on the day of admission. ED evaluation included a CT of the head and cervical spine. The fractures were identified on that basis. She is not communicative secondary to the dementia. She was admitted for observation on the basis that she was more lethargic. I spoke with the ED and informed them that she needed a cervical collar and outpatient follow up, as her treatment would be conservative.  Allergies  Allergen Reactions  . Ace Inhibitors     REACTION: Swelling  . Bystolic [Nebivolol Hcl] Other (See Comments)    Bradycardia  . Ciprofloxacin     REACTION: GI Upset   Prior to Admission medications   Medication Sig Start Date End Date Taking? Authorizing Provider  Calcium Carbonate-Vitamin D (CALCIUM + D PO) Take 600 mg by mouth 2 (two) times daily.     Yes Historical Provider, MD  feeding supplement (ENSURE IMMUNE HEALTH) LIQD Take 237 mLs by mouth daily with supper. VANILLA   Yes Historical Provider, MD  ferrous sulfate 325 (65 FE) MG tablet Take 325 mg by mouth daily with breakfast.   Yes Historical Provider, MD  levothyroxine (SYNTHROID, LEVOTHROID) 125 MCG tablet Take 125 mcg by mouth daily before breakfast.   Yes Historical Provider, MD  LORazepam (ATIVAN) 0.5 MG tablet Take 0.5 mg by mouth every 4 (four) hours as needed for anxiety.  03/13/13  Yes Mahima Bubba Camp, MD  losartan (COZAAR) 25 MG tablet Take 25 mg by mouth 2 (two) times daily.     Yes Historical Provider, MD  OLANZapine (ZYPREXA) 2.5 MG tablet Take 2.5 mg by mouth at bedtime.   Yes Historical Provider, MD  polyethylene glycol (MIRALAX / GLYCOLAX) packet Take 17 g by mouth daily as needed (for  constipation).   Yes Historical Provider, MD  psyllium (REGULOID) 0.52 G capsule Take 0.52 g by mouth daily.   Yes Historical Provider, MD  senna-docusate (SENOKOT-S) 8.6-50 MG per tablet Take 2 tablets by mouth daily.   Yes Historical Provider, MD  ursodiol (ACTIGALL) 300 MG capsule Take 300 mg by mouth 3 (three) times daily.    Yes Historical Provider, MD  zolpidem (AMBIEN) 5 MG tablet Take 5 mg by mouth at bedtime as needed for sleep.   Yes Historical Provider, MD   Past Medical History  Diagnosis Date  . Hypertension   . Alzheimer disease   . Depression   . Breast cancer   . Sleep apnea   . Hypothyroidism   . Anxiety   . Syncope and collapse   . Sinus bradycardia   . UTI (urinary tract infection)     ACUTE  . Dehydration     ACUTE  . Bradycardia    Past Surgical History  Procedure Laterality Date  . Cholecystectomy    . Breast surgery      bilteral mastectomy's   Family History  Problem Relation Age of Onset  . Heart failure Mother   . Parkinsonism Father    History   Social History  . Marital Status: Married    Spouse Name: N/A  . Number of Children: N/A  . Years of Education: N/A  Occupational History  . Not on file.   Social History Main Topics  . Smoking status: Former Smoker    Quit date: 12/21/1987  . Smokeless tobacco: Never Used  . Alcohol Use: No  . Drug Use: No  . Sexual Activity: Not Currently    Birth Control/ Protection: Post-menopausal   Other Topics Concern  . Not on file   Social History Narrative   Physical Exam  Constitutional:  cachectic  HENT:  Ecchymoses on right side of face  Eyes: Pupils are equal, round, and reactive to light.  Neck:  Immobilized in cervical collar  Cardiovascular: Normal rate.   Pulmonary/Chest: Effort normal.  Abdominal: Soft.  Neurological:  Alert, not oriented  Non fluent, does not follow commands Moving all extremities Symmetric facies Unable to perform motor exam due to mental  status. Unable to perform sensory exam due to mental status.  Skin: Skin is warm and dry.  Psychiatric:  demented  Vitals reviewed. A/P Treatment with collar for displaced fracture C1. Not at all a good surgical candidate. Believe treatment with collar will suffice. Will follow as outpatient.

## 2014-11-02 DIAGNOSIS — E876 Hypokalemia: Secondary | ICD-10-CM

## 2014-11-02 LAB — BASIC METABOLIC PANEL
Anion gap: 5 (ref 5–15)
BUN: 18 mg/dL (ref 6–23)
CALCIUM: 8.8 mg/dL (ref 8.4–10.5)
CO2: 28 mmol/L (ref 19–32)
Chloride: 103 mmol/L (ref 96–112)
Creatinine, Ser: 0.67 mg/dL (ref 0.50–1.10)
GFR calc Af Amer: >90 mL/min (ref >90)
GFR, EST NON AFRICAN AMERICAN: 78 mL/min — AB (ref >90)
GLUCOSE: 106 mg/dL — AB (ref 70–99)
Potassium: 3.8 mmol/L (ref 3.5–5.1)
Sodium: 136 mmol/L (ref 135–145)

## 2014-11-02 LAB — AMMONIA: Ammonia: 36 umol/L — ABNORMAL HIGH (ref 11–32)

## 2014-11-02 MED ORDER — OLANZAPINE 2.5 MG PO TABS
2.5000 mg | ORAL_TABLET | Freq: Every day | ORAL | Status: DC
  Administered 2014-11-02: 2.5 mg via ORAL
  Filled 2014-11-02 (×2): qty 1

## 2014-11-02 MED ORDER — URSODIOL 300 MG PO CAPS
300.0000 mg | ORAL_CAPSULE | Freq: Three times a day (TID) | ORAL | Status: DC
  Administered 2014-11-02 – 2014-11-03 (×2): 300 mg via ORAL
  Filled 2014-11-02 (×5): qty 1

## 2014-11-02 MED ORDER — ACETAMINOPHEN 160 MG/5ML PO SOLN: 650.0000 mg | Freq: Two times a day (BID) | ORAL | Status: DC | PRN

## 2014-11-02 MED ORDER — RESOURCE THICKENUP CLEAR PO POWD
ORAL | Status: DC | PRN
  Filled 2014-11-02: qty 125

## 2014-11-02 MED ORDER — WHITE PETROLATUM GEL
Status: AC
  Administered 2014-11-02: 0.2
  Filled 2014-11-02: qty 1

## 2014-11-02 MED ORDER — ENSURE ENLIVE PO LIQD
237.0000 mL | Freq: Two times a day (BID) | ORAL | Status: DC
  Administered 2014-11-02 – 2014-11-03 (×3): 237 mL via ORAL

## 2014-11-02 MED ORDER — LEVOTHYROXINE SODIUM 25 MCG PO TABS
125.0000 ug | ORAL_TABLET | Freq: Every day | ORAL | Status: DC
  Administered 2014-11-03: 125 ug via ORAL
  Filled 2014-11-02 (×2): qty 1

## 2014-11-02 MED ORDER — ACETAMINOPHEN 160 MG/5ML PO SOLN
650.0000 mg | Freq: Four times a day (QID) | ORAL | Status: DC
  Administered 2014-11-02 – 2014-11-03 (×5): 650 mg via ORAL
  Filled 2014-11-02 (×5): qty 20.3

## 2014-11-02 NOTE — Evaluation (Addendum)
Clinical/Bedside Swallow Evaluation Patient Details  Name: [name] Briggs MRN: [id number] Date of Birth: Dec 11, 1926  Today's Date: 11/02/2014 Time: SLP Start Time (ACUTE ONLY): 1004 SLP Stop Time (ACUTE ONLY): 1024 SLP Time Calculation (min) (ACUTE ONLY): 20 min  Past Medical History:  Past Medical History  Diagnosis Date  . Hypertension   . Alzheimer disease   . Depression   . Breast cancer   . Sleep apnea   . Hypothyroidism   . Anxiety   . Syncope and collapse   . Sinus bradycardia   . UTI (urinary tract infection)     ACUTE  . Dehydration     ACUTE  . Bradycardia    Past Surgical History:  Past Surgical History  Procedure Laterality Date  . Cholecystectomy    . Breast surgery      bilteral mastectomy's   HPI:  81 year old female with a history of Alzheimer's dementia who presents following a fall from memory care unit. Per MD note pt has laceration to the forehead and abrasion to the left knee.CT negative for acute abnormality. CXR no acute cardiopulmonary disease.   Assessment / Plan / Recommendation Clinical Impression  Pt confused, restless, internally distracted by mittens, sister at bedside. Delayed and discoordinated swallow initiation and swallow x 2. Pt semi crying/talking throughout eval increasing risk. Recommend Dys 1 and nectar thick liquids, no straws, full supevision/assist, pills crushed and ST follow up next date re: plan for upgrade liquids vs objective study.      Aspiration Risk   (mod-severe)    Diet Recommendation Dysphagia 1 (Puree);Nectar-thick liquid   Liquid Administration via: Cup;No straw Medication Administration: Crushed with puree Supervision: Full supervision/cueing for compensatory strategies;Staff to assist with self feeding Compensations: Slow rate;Small sips/bites Postural Changes and/or Swallow Maneuvers: Seated upright 90 degrees    Other  Recommendations Oral Care Recommendations: Oral care BID Other Recommendations: Order  thickener from pharmacy   Follow Up Recommendations   (skilled vs memory care unit)    Frequency and Duration min 2x/week  2 weeks   Pertinent Vitals/Pain Restless, appears distracted by mittens, intermittent crying      Swallow Study          Oral/Motor/Sensory Function Overall Oral Motor/Sensory Function:  (unable to volitionally peform, no apparent impairments)   Ice Chips Ice chips: Not tested   Thin Liquid Thin Liquid: Impaired Presentation: Cup;Spoon;Straw Oral Phase Impairments: Reduced labial seal;Poor awareness of bolus Oral Phase Functional Implications: Left anterior spillage;Right anterior spillage Pharyngeal  Phase Impairments: Suspected delayed Swallow;Decreased hyoid-laryngeal movement;Multiple swallows    Nectar Thick Nectar Thick Liquid: Not tested   Honey Thick Honey Thick Liquid: Not tested   Puree Puree: Impaired Pharyngeal Phase Impairments: Suspected delayed Swallow;Decreased hyoid-laryngeal movement;Multiple swallows   Solid   GO    Solid: Not tested (on puree or minced at memory care per sister)       Houston Siren 11/02/2014,10:40 AM   Orbie Pyo Colvin Caroli.[employer] [phone]

## 2014-11-02 NOTE — Evaluation (Signed)
Physical Therapy Evaluation and Discharge Patient Details Name: [name] Briggs MRN: [id number] DOB: 15-Sep-1926 Today's Date: 11/02/2014   History of Present Illness  Resident of [facility] adm s/p unwitnessed fall. CT scan was positive for C1 fracture PMHx-hypertension, Alzheimer's dementia, hypothyroidism, recurrent fall, depression    Clinical Impression  Pt admitted from SNF with multiple falls per chart. Pt's family is pursuing placement at ?memory care unit. Unclear if this facility provides equipment or therapies for residents. Would recommend a wheelchair with specialty cushion (to decr pressure/risk of skin breakdown and potentially for positioning). Feel a short course of PT once she is at the facility is appropriate to assist with developing a plan with staff for equipment and mobility to maximize pt's safety and decr falls. No further acute PT is indicated.     Follow Up Recommendations Supervision/Assistance - 24 hour (family pursuing ?memory care unit)    Equipment Recommendations  Wheelchair (measurements PT);Wheelchair cushion (measurements PT) (unless provided by facility)    Recommendations for Other Services       Precautions / Restrictions Precautions Precautions: Cervical;Fall Restrictions Weight Bearing Restrictions: No      Mobility  Bed Mobility Overal bed mobility: Needs Assistance Bed Mobility: Rolling;Sidelying to Sit;Sit to Sidelying Rolling: Mod assist Sidelying to sit: Mod assist;HOB elevated     Sit to sidelying: Max assist General bed mobility comments: not following pure verbal commands; once movement intitiated via manual facilitation, pt begins to assist/complete motion  Transfers Overall transfer level: Needs assistance Equipment used: None Transfers: Sit to/from Stand Sit to Stand: Mod assist         General transfer comment: pt initiating attempt to stand  Ambulation/Gait             General Gait Details: unable to  get pt to attempt even side-stepping towards [place]            Wheelchair Mobility    Modified Rankin (Stroke Patients Only)       Balance Overall balance assessment: History of Falls;Needs assistance Sitting-balance support: No upper extremity supported;Feet supported Sitting balance-Leahy Scale: Poor Sitting balance - Comments: initial lean to Lt, then posterior; sat for <10 sec with minguard assist (otherwise min assist); EOB x 3 minutes   Standing balance support: No upper extremity supported Standing balance-Leahy Scale: Poor                               Pertinent Vitals/Pain Pain Assessment: Faces Faces Pain Scale: Hurts worst Pain Location: "everywhere" Pain Intervention(s): Monitored during session;Limited activity within patient's tolerance;Repositioned    Home Living Family/patient expects to be discharged to:: Skilled nursing facility                      Prior Function Level of Independence: Needs assistance   Gait / Transfers Assistance Needed: apparently ambulatory with falls (per chart)           Hand Dominance        Extremity/Trunk Assessment   Upper Extremity Assessment: Generalized weakness;Defer to OT evaluation           Lower Extremity Assessment: Generalized weakness (dorsiflexion bil limited to neutral)      Cervical / Trunk Assessment: Normal  Communication   Communication: Other (comment) (very delayed)  Cognition Arousal/Alertness: Awake/alert Behavior During Therapy: Flat affect Overall Cognitive Status: No family/caregiver present to determine baseline cognitive functioning (h/o dementia)  General Comments General comments (skin integrity, edema, etc.): on arrival, pt with Rt side of cervical collar completely unhooked. RN called and made aware. She requested don the mittens in pt's room    Exercises        Assessment/Plan    PT Assessment All further PT  needs can be met in the next venue of care  PT Diagnosis Generalized weakness;Acute pain;Altered mental status   PT Problem List Decreased strength;Decreased activity tolerance;Decreased balance;Decreased mobility;Decreased cognition;Decreased safety awareness;Decreased knowledge of precautions;Pain  PT Treatment Interventions     PT Goals (Current goals can be found in the Care Plan section) Acute Rehab PT Goals Patient Stated Goal: pt unable PT Goal Formulation: All assessment and education complete, DC therapy    Frequency     Barriers to discharge        Co-evaluation               End of Session Equipment Utilized During Treatment: Gait belt;Cervical collar Activity Tolerance: Patient limited by pain Patient left: in bed;with call bell/phone within reach;with bed alarm set;with restraints reapplied;with SCD's reapplied Nurse Communication: Mobility status;Other (comment) (incr pain)         Time: 2179-8102 PT Time Calculation (min) (ACUTE ONLY): 17 min   Charges:   PT Evaluation $Initial PT Evaluation Tier I: 1 Procedure     PT G Codes:        [name] Briggs November 11, 2014, 9:54 AM Pager [phone]

## 2014-11-02 NOTE — Evaluation (Signed)
Occupational Therapy Evaluation Patient Details Name: [name] Briggs MRN: [id number] DOB: 1927-07-26 Today's Date: 11/02/2014    History of Present Illness Resident of [facility] home adm s/p unwitnessed fall. CT scan was positive for C1 fracture PMHx-hypertension, Alzheimer's dementia, hypothyroidism, recurrent fall, depression   Clinical Impression   Patient admitted with above. According to chart, PTA patient ambulating, but with history of multiple falls. Patient currently functioning at an overall total > total assist +2 level. D/C from acute OT services and all further OT needs can be met in next venue of care. No evidence of learning when therapist educated patient. Please re-order OT if needed.      Follow Up Recommendations  SNF;Supervision/Assistance - 24 hour    Equipment Recommendations   (TBD)    Recommendations for Other Services  None at this time     Precautions / Restrictions Precautions Precautions: Cervical;Fall Required Braces or Orthoses: Cervical Brace Cervical Brace: Hard collar Restrictions Weight Bearing Restrictions: No      Mobility Bed Mobility General bed mobility comments: did not occur secondary to patient grimacing in pain when attempting See PT eval for more information  Transfers General transfer comment: did not occur during OT eval, please see PT eval for more information    Balance  See PT evaluation for more information    ADL Overall ADL's : Needs assistance/impaired Eating/Feeding: Total assistance;Bed level   Grooming: Total assistance;Bed level   Upper Body Bathing: Total assistance;Bed level   Lower Body Bathing: Total assistance;Bed level   Upper Body Dressing : Total assistance;Bed level   Lower Body Dressing: Total assistance;Bed level     General ADL Comments: Patient up to total assist +2 for ADLs and functional mobility/transfers. Patient follows one-step commands inconsistently. No acute OT services needed,  deferring patient to SNF OT for further evaluation and rehab.      Vision Additional Comments: Unable to fully assess secondary to cognitive mpairments           Pertinent Vitals/Pain Pain Assessment: Faces Faces Pain Scale: Hurts worst Pain Descriptors / Indicators: Crying;Guarding Pain Intervention(s): Monitored during session   Extremity/Trunk Assessment Upper Extremity Assessment Upper Extremity Assessment: Difficult to assess due to impaired cognition   Lower Extremity Assessment Lower Extremity Assessment: Defer to PT evaluation   Cervical / Trunk Assessment Cervical / Trunk Assessment: Normal   Communication Communication Communication: Other (comment) (delayed and hard to understand (pt edentulous))   Cognition Arousal/Alertness: Awake/alert Behavior During Therapy: Flat affect Overall Cognitive Status: No family/caregiver present to determine baseline cognitive functioning (history of dementia)             Home Living Family/patient expects to be discharged to:: Skilled nursing facility   Prior Functioning/Environment Level of Independence: Needs assistance  Gait / Transfers Assistance Needed: apparently ambulatory with falls (per chart)    OT Diagnosis:  n/a, all further OT needs can be met in next venue of care   OT Problem List:   n/a, all further OT needs can be met in next venue of care   OT Treatment/Interventions:   n/a, all further OT needs can be met in next venue of care   OT Goals(Current goals can be found in the care plan section) Acute Rehab OT Goals Patient Stated Goal: pt unable  OT Frequency:   n/a, all further OT needs can be met in next venue of care              End of Session  Activity Tolerance: Patient limited by pain Patient left: in bed;with call bell/phone within reach;with bed alarm set   Time: 1453-1505 OT Time Calculation (min): 12 min Charges:  OT General Charges $OT Visit: 1 Procedure OT Evaluation $Initial OT  Evaluation Tier I: 1 Procedure  Boyce Keltner , MS, OTR/L, CLT Pager: [phone]  11/02/2014, 3:15 PM

## 2014-11-02 NOTE — Clinical Documentation Improvement (Signed)
Please clarify nutritional status. Thank you.  . Severity: --Mild (first degree) --Moderate (second degree) --Severe (third degree) . Avoid documenting a range of severity, such as "moderate to severe" . Form: --Kwashi[place]or (rarely seen in the [place] --Marasmus --Marasmic kwashiorkor --Other . Document any associated diagnoses/conditions  Supporting Information:   Per RD assessment: Per approved criteria  -Severe malnutrition in the context of chronic illness -Underweight      Pt meets criteria for SEVERE MALNUTRITION in the context of CHRONIC ILLNESS as evidenced by severe muscle wasting and severe loss of subcutaneous fat mass. NUTRITION DIAGNOSIS: Malnutrition related to dementia, advanced age, and poor PO intake as evidenced by severe muscle and fat wasting per physical exam  Height 5'7"     Weight  115 lbs   BMI 18.02  INTERVENTION: Diet advancement per MD/SLP  Add Ensure Enlive po BID when diet is advanced, each supplement provides 350 kcal and 20 grams of protein  Provide Magic Cup ice cream with meal trays when diet is advanced, each supplement provides 290 kcal and 9 grams of protein  RD to continue to monitor   Thank You, Arnell Slivinski T. Pricilla Handler, MSN, MBA/MHA Clinical Documentation Specialist [email or web address] Office # [phone]

## 2014-11-02 NOTE — Progress Notes (Addendum)
[facility]                                                                              Patient Demographics  [name] Briggs, is a 81 y.o. female, DOB - [birth year], PYP:[id number]  Admit date - 10/30/2014   Admitting Physician [name] Monte, MD  Outpatient Primary MD for the patient is MORAITIS, Farley Ly, PA-C  LOS -    Chief Complaint  Patient presents with  . Fall      Interim history 81 year old with history of hypertension, advanced Alzheimer's dementia, hypothyroidism who resides at an ALF was found after sustaining a fall. Fall was not witnessed. The patient was brought to emergency department where she had negative CT of the head and maxillofacial areas however positive C1 fracture of cervical spine. Patient also has a right frontal hematoma. Neurosurgery (Dr. Christella Briggs) consulted by the EDP and was recommended cervical collar placement, nonsurgical management and outpatient follow-up in 2 weeks.  Assessment & Plan   Acute encephalopathy/underlying dementia - Patient has advanced dementia as per discussion with her son [name] Briggs. Current altered mental status may be secondary to opioid pain medications complicating underlying dementia. Seems to have improved compared to 3/21. - At baseline, she apparently is quite confused and cannot respond appropriately to questions or instructions, however ambulates without assistance. -TSH 5.299, currently on synthroid -UA & chest x-ray negative for infection -CT head: no evidence of hemorrhage or contusion - Ammonia level mildly elevated at 66 >decreased to 36 without treatment. However with normal LFTs, unclear significance and do not believe that she has hepatic encephalopathy. - Placed on scheduled rectal Tylenol for pain control and minimized opioids. May consider lidocaine patch if pain persists. Monitor mental status-alert this morning. - Speech therapy has started modified diet. - Patient on multiple psych meds: prn Depakote, prn  Ativan & Effexor: Currently held secondary to recent sedation. Will restart Zyprexa 2.5 MG daily at bedtime-due to agitation.  Hypothyroidism -TSH 5.299, free T4: 0.97 -Will continue Synthroid IV until patient consistently tolerating by mouth. - Recommend repeating TFTs in 4-6 weeks.  C1 & left occipital Condylar fracture - Neurosurgery consultation 8/21 appreciated: Treatment with cervical collar for displaced C1 fracture. Not a good surgical candidate. - We will need cervical collar for 2-3 months. - Outpatient follow-up with neurosurgery in 2 weeks.  Leukocytosis -Likely reactive -UA negative, chest x-ray negative. - Resolved.   Hematoma/sutured scalp laceration -Noted on CT scan, secondary to fall  -no active bleeding - Hemoglobin stable.   Fall -Unclear etiology at this time as patient's fall was unwitnessed- possibly due to polypharmacy -CT of the head conducted twice showed no abnormalities -X-ray of the knees also show no fractures or abnormalities -Patient spontaneously moving all extremities -PT, OT input appreciated  Hypertension -Continue hydralazine when necessary -Losartan/HCTZ held. Mostly controlled and intermittently soft blood pressures. Resume when blood pressures consistently start increasing.  Hypokalemia - Replaced  DO NOT RESUSCITATE/failure to thrive - As per son, patient was on hospice until 8-9 months ago. Patient is likely to rapidly decline from current C1 fracture and limited mobility. Suggested palliative care consultation for goals of care and he was agreeable to same- was called  3/21- pending.  Chronic Thrombocytopenia - stable.   SEVERE MALNUTRITION in the context of CHRONIC ILLNESS  - Management per dietitian recommendations.   Code Status: DNR Family Communication: Discussed with patient's son Mr. [name] Briggs on 3/21. Disposition Plan:  Possible discharge to memory care unit pending palliative care consultation in 1-2 days.  Time Spent in  minutes   30 minutes  Procedures  None  Consults   Neurosurgery Palliative care team-pending  DVT Prophylaxis  SCDs  Lab Results  Component Value Date   PLT 128* 11/01/2014    Medications  Scheduled Meds: . acetaminophen  650 mg Rectal QID  . levothyroxine  62.5 mcg Intravenous Daily  . sodium chloride  3 mL Intravenous Q12H   Continuous Infusions:   PRN Meds:.[DISCONTINUED] acetaminophen **OR** acetaminophen, hydrALAZINE, morphine injection, [DISCONTINUED] ondansetron **OR** ondansetron (ZOFRAN) IV, RESOURCE THICKENUP CLEAR  Antibiotics    Anti-infectives    None        Subjective:   [name] Briggs seen and examined today.  Patient alert and confused this morning. Intermittently crying. Does not appear in significant pain.  Objective:   Filed Vitals:   11/02/14 0133 11/02/14 0510 11/02/14 0956 11/02/14 1220  BP: 140/62 105/87 185/84 133/72  Pulse: 69 74 72 69  Temp: 97.6 F (36.4 C) 98.1 F (36.7 C) 98 F (36.7 C)   TempSrc: Axillary Axillary Axillary   Resp: 16 16 16    Weight:  51.6 kg (113 lb 12.1 oz)    SpO2: 97% 98% 98%     Wt Readings from Last 3 Encounters:  11/02/14 51.6 kg (113 lb 12.1 oz)  03/23/14 54.432 kg (120 lb)  03/26/13 54.9 kg (121 lb 0.5 oz)     Intake/Output Summary (Last 24 hours) at 11/02/14 1245 Last data filed at 11/02/14 0814  Gross per 24 hour  Intake  547.5 ml  Output      0 ml  Net  547.5 ml    Exam  General: frail elderly female lying comfortably propped up in bed.  HEENT:  Frontal hematoma and sutured laceration-intact, facial bruising/right periorbital ecchymosis. Cervical collar in place.  Cardiovascular: S1 S2 auscultated, RRR. No murmurs or pedal edema.   Respiratory: Decreased inspiratory effort but seems clear to auscultation.  Abdomen: Soft, nontender, nondistended, + bowel sounds  Extremities: warm dry without cyanosis clubbing or edema. Spontaneously moving all extremities. Peripheral pulses  symmetrically felt.  Neuro: Alert and not oriented. Does not follow instructions. No focal neurological deficits.   Data Review   Micro Results Recent Results (from the past 240 hour(s))  MRSA PCR Screening     Status: None   Collection Time: 11/01/14  5:42 PM  Result Value Ref Range Status   MRSA by PCR NEGATIVE NEGATIVE Final    Comment:        The GeneXpert MRSA Assay (FDA approved for NASAL specimens only), is one component of a comprehensive MRSA colonization surveillance program. It is not intended to diagnose MRSA infection nor to guide or monitor treatment for MRSA infections.     Radiology Reports Ct Head Wo Contrast  10/31/2014   CLINICAL DATA:  [place]  EXAM: CT HEAD WITHOUT CONTRAST  TECHNIQUE: Contiguous axial images were obtained from the base of the skull through the vertex without intravenous contrast.  COMPARISON:  10/30/2014  FINDINGS: There is no intracranial hemorrhage or extra-axial fluid collection. There is moderately severe generalized atrophy. There is no evidence of acute infarction. There is no mass effect or midline shift.  Basal cisterns remain patent. No interval change is evident from the scan obtained yesterday. Right frontal scalp hematoma again evident.  The C1 fractures and left occipital condyle fracture are again evident, incompletely imaged on this study.  IMPRESSION: No evidence of hemorrhage, contusion or other evolving intracranial traumatic injury. Generalized atrophy. Right frontal scalp hematoma. Known fractures at C1 and left occipital condyle, incompletely imaged.   Electronically Signed   By: Andreas Newport M.D.   On: 10/31/2014 00:51   Ct Head Wo Contrast  10/30/2014   CLINICAL DATA:  Fall, scalp laceration over the frontal region, patient cannot give history  EXAM: CT HEAD WITHOUT CONTRAST  CT CERVICAL SPINE WITHOUT CONTRAST  TECHNIQUE: Multidetector CT imaging of the head and cervical spine was performed following the standard protocol  without intravenous contrast. Multiplanar CT image reconstructions of the cervical spine were also generated.  COMPARISON:  07/11/2014  FINDINGS: CT HEAD FINDINGS  Large frontal scalp laceration/hematoma. No skull fracture. Prominent nutrient foramen left orbital roof stable from 07/11/2014. Motion artifact severely degrades this study. There is diffuse atrophy with no abnormal attenuation to suggest infarct mass hemorrhage or extra-axial fluid. Proportional dilatation of the ventricles noted.  CT CERVICAL SPINE FINDINGS  There are fractures of the anterior and posterior aspects of the C1 ring, both mildly displaced with comminution involving the posterior right C1 fracture. There is a mildly displaced fracture involving the left occipital condyle.  There is reversed lordosis. There is severe C4-5, C5-6, and C6-7 degenerative disc disease. There are no other fractures.  IMPRESSION: 1. No acute intracranial abnormality. Study moderately limited by motion. 2. Acute fractures of C1 and left occipital condyle. Critical Value/emergent results were called by telephone at the time of interpretation on 10/30/2014 at 8:39 am to Dr. Thayer Jew , who verbally acknowledged these results.   Electronically Signed   By: Skipper Cliche M.D.   On: 10/30/2014 08:39   Ct Cervical Spine Wo Contrast  10/30/2014   CLINICAL DATA:  Fall, scalp laceration over the frontal region, patient cannot give history  EXAM: CT HEAD WITHOUT CONTRAST  CT CERVICAL SPINE WITHOUT CONTRAST  TECHNIQUE: Multidetector CT imaging of the head and cervical spine was performed following the standard protocol without intravenous contrast. Multiplanar CT image reconstructions of the cervical spine were also generated.  COMPARISON:  07/11/2014  FINDINGS: CT HEAD FINDINGS  Large frontal scalp laceration/hematoma. No skull fracture. Prominent nutrient foramen left orbital roof stable from 07/11/2014. Motion artifact severely degrades this study. There is  diffuse atrophy with no abnormal attenuation to suggest infarct mass hemorrhage or extra-axial fluid. Proportional dilatation of the ventricles noted.  CT CERVICAL SPINE FINDINGS  There are fractures of the anterior and posterior aspects of the C1 ring, both mildly displaced with comminution involving the posterior right C1 fracture. There is a mildly displaced fracture involving the left occipital condyle.  There is reversed lordosis. There is severe C4-5, C5-6, and C6-7 degenerative disc disease. There are no other fractures.  IMPRESSION: 1. No acute intracranial abnormality. Study moderately limited by motion. 2. Acute fractures of C1 and left occipital condyle. Critical Value/emergent results were called by telephone at the time of interpretation on 10/30/2014 at 8:39 am to Dr. Thayer Jew , who verbally acknowledged these results.   Electronically Signed   By: Skipper Cliche M.D.   On: 10/30/2014 08:39   Dg Chest Port 1 View  10/31/2014   CLINICAL DATA:  Acute encephalopathy. History of hypertension. History of breast carcinoma.  EXAM: PORTABLE CHEST - 1 VIEW  COMPARISON:  03/23/2014  FINDINGS: Cardiac silhouette mildly enlarged.  No mediastinal or hilar masses.  No lung consolidation or edema. No pleural effusion or pneumothorax.  Stable changes from left breast surgery.  Bony thorax is demineralized but grossly intact.  IMPRESSION: No acute cardiopulmonary disease.   Electronically Signed   By: Lajean Manes M.D.   On: 10/31/2014 12:17   Dg Knee Complete 4 Views Left  10/30/2014   CLINICAL DATA:  Fall, left knee abrasions, trauma, pain  EXAM: LEFT KNEE - COMPLETE 4+ VIEW  COMPARISON:  None.  FINDINGS: Osteopenia noted. Atrophy of the left lower extremity musculature. Normal alignment without acute displaced fracture or joint effusion. Lateral view is limited with motion artifact. Peripheral atherosclerosis noted.  IMPRESSION: No acute osseous finding or displaced fracture.  Osteopenia    Electronically Signed   By: Jerilynn Mages.  Shick M.D.   On: 10/30/2014 10:36   Ct Maxillofacial Wo Cm  10/30/2014   CLINICAL DATA:  Acute fall with forehead and right eye injury. Initial encounter.  EXAM: CT MAXILLOFACIAL WITHOUT CONTRAST  TECHNIQUE: Multidetector CT imaging of the maxillofacial structures was performed. Multiplanar CT image reconstructions were also generated. A small metallic BB was placed on the right temple in order to reliably differentiate right from left.  COMPARISON:  10/30/2014, 07/11/2014 and prior CTs.  FINDINGS: No acute facial fracture identified.  Right preseptal facial soft tissue swelling is identified. The globes and orbits are unremarkable. There is no evidence of postseptal or intraconal abnormality.  The paranasal sinuses, mastoid air cells and middle/ inner ears are clear.  C1 and left occipital condyle fractures again noted as seen on earlier exams today.  IMPRESSION: No evidence of acute facial fracture.  Right preseptal facial soft tissue swelling.   Electronically Signed   By: Margarette Canada M.D.   On: 10/30/2014 18:16    CBC  Recent Labs Lab 10/30/14 1530 10/31/14 0845 11/01/14 0750  WBC 11.1* 9.3 7.7  HGB 13.1 12.5 12.2  HCT 38.8 37.1 35.2*  PLT 119* 140* 128*  MCV 91.7 92.1 91.2  MCH 31.0 31.0 31.6  MCHC 33.8 33.7 34.7  RDW 13.2 13.4 13.4  LYMPHSABS 0.6* 0.9  --   MONOABS 0.5 0.6  --   EOSABS 0.0 0.0  --   BASOSABS 0.0 0.0  --     Chemistries   Recent Labs Lab 10/30/14 1530 10/31/14 0620 11/01/14 0750 11/01/14 0817 11/02/14 0640  NA 139 138 137  --  136  K 3.4* 3.3* 3.2*  --  3.8  CL 100 101 101  --  103  CO2 28 26 28   --  28  GLUCOSE 130* 115* 98  --  106*  BUN 15 13 15   --  18  CREATININE 0.68 0.66 0.64  --  0.67  CALCIUM 10.1 9.3 8.7  --  8.8  MG  --   --   --  1.8  --   AST  --  36  --   --   --   ALT  --  20  --   --   --   ALKPHOS  --  56  --   --   --   BILITOT  --  1.0  --   --   --     ------------------------------------------------------------------------------------------------------------------ CrCl cannot be calculated (Unknown ideal weight.). ------------------------------------------------------------------------------------------------------------------ No results for input(s): HGBA1C in the last 72 hours. ------------------------------------------------------------------------------------------------------------------ No results for input(s): CHOL,  HDL, LDLCALC, TRIG, CHOLHDL, LDLDIRECT in the last 72 hours. ------------------------------------------------------------------------------------------------------------------  Recent Labs  10/31/14 0045  TSH 5.299*   ------------------------------------------------------------------------------------------------------------------ No results for input(s): VITAMINB12, FOLATE, FERRITIN, TIBC, IRON, RETICCTPCT in the last 72 hours.  Coagulation profile No results for input(s): INR, PROTIME in the last 168 hours.  No results for input(s): DDIMER in the last 72 hours.  Cardiac Enzymes No results for input(s): CKMB, TROPONINI, MYOGLOBIN in the last 168 hours.  Invalid input(s): CK ------------------------------------------------------------------------------------------------------------------ Invalid input(s): POCBNP  Time spent: 40 minutes.  Vernell Leep, MD, FACP, FHM. [facility] Pager [phone]  If 7PM-7AM, please contact night-coverage [email or web address] Password [facility] 11/02/2014, 12:45 PM

## 2014-11-03 DIAGNOSIS — E032 Hypothyroidism due to medicaments and other exogenous substances: Secondary | ICD-10-CM

## 2014-11-03 DIAGNOSIS — Z515 Encounter for palliative care: Secondary | ICD-10-CM

## 2014-11-03 DIAGNOSIS — M542 Cervicalgia: Secondary | ICD-10-CM

## 2014-11-03 MED ORDER — LOSARTAN POTASSIUM 25 MG PO TABS: 25.0000 mg | ORAL_TABLET | Freq: Two times a day (BID) | ORAL | Status: AC

## 2014-11-03 MED ORDER — ZOLPIDEM TARTRATE 5 MG PO TABS: 5.0000 mg | ORAL_TABLET | Freq: Every evening | ORAL | Status: AC | PRN

## 2014-11-03 MED ORDER — POLYETHYLENE GLYCOL 3350 17 G PO PACK: 17.0000 g | PACK | Freq: Every day | ORAL | Status: AC | PRN

## 2014-11-03 MED ORDER — LOSARTAN POTASSIUM 50 MG PO TABS
25.0000 mg | ORAL_TABLET | Freq: Two times a day (BID) | ORAL | Status: DC
  Administered 2014-11-03: 25 mg via ORAL
  Filled 2014-11-03: qty 1

## 2014-11-03 MED ORDER — PSYLLIUM 0.52 G PO CAPS: 0.5200 g | ORAL_CAPSULE | Freq: Every day | ORAL | Status: AC

## 2014-11-03 MED ORDER — FERROUS SULFATE 325 (65 FE) MG PO TABS: 325.0000 mg | ORAL_TABLET | Freq: Every day | ORAL | Status: AC

## 2014-11-03 MED ORDER — LEVOTHYROXINE SODIUM 125 MCG PO TABS: 125.0000 ug | ORAL_TABLET | Freq: Every day | ORAL | Status: AC

## 2014-11-03 MED ORDER — URSODIOL 300 MG PO CAPS: 300.0000 mg | ORAL_CAPSULE | Freq: Three times a day (TID) | ORAL | Status: AC

## 2014-11-03 MED ORDER — TRAMADOL HCL 50 MG PO TABS: 50.0000 mg | ORAL_TABLET | Freq: Two times a day (BID) | ORAL | Status: AC | PRN

## 2014-11-03 MED ORDER — FERROUS SULFATE 325 (65 FE) MG PO TABS
325.0000 mg | ORAL_TABLET | Freq: Every day | ORAL | Status: DC
  Administered 2014-11-03: 325 mg via ORAL
  Filled 2014-11-03: qty 1

## 2014-11-03 MED ORDER — OLANZAPINE 2.5 MG PO TABS: 2.5000 mg | ORAL_TABLET | Freq: Every day | ORAL | Status: AC

## 2014-11-03 MED ORDER — ENSURE ENLIVE PO LIQD: 237.0000 mL | Freq: Two times a day (BID) | ORAL | Status: AC

## 2014-11-03 NOTE — Progress Notes (Signed)
Speech Language Pathology Treatment: Dysphagia  Patient Details Name: [name] Briggs MRN: [id number] DOB: [birth year] Today's Date: 11/03/2014 Time: 2458-0998 SLP Time Calculation (min) (ACUTE ONLY): 17 min  Assessment / Plan / Recommendation Clinical Impression  Dysphagia therapy with further diagnostic treatment, no family present. Pt calm and cooperative. Pt able to provide 60% effort during self feeding with cup requiring tactile cues to reach oral cavity and consume small sips.Trials of thin provided with immediate cough with 2/3 sips suspect from premature spill from oral cavity. Nectar liquids did not result in aspiration signs. Suspect pharyngeal or tongue base residue with typically 2 swallows following trials. Recommend continue Dys 1, nectar thick and likely will discharge on this diet. Lung sounds have remained stable and afebrile. Would recommend MBS if respiratory status or signs of infection present. Will follow for family education and continued pt safety.   HPI HPI: 81 yr old with history of hypertension and peripheral neuropathy admitted with aphasia. MRI no acute intracranial infarct or other abnormality identified.   Pertinent Vitals Pain Assessment: No/denies pain  SLP Plan  Continue with current plan of care    Recommendations Diet recommendations: Dysphagia 1 (puree);Nectar-thick liquid Liquids provided via: Cup;No straw Medication Administration: Crushed with puree Supervision: Full supervision/cueing for compensatory strategies;Staff to assist with self feeding Compensations: Slow rate;Small sips/bites Postural Changes and/or Swallow Maneuvers: Seated upright 90 degrees              Oral Care Recommendations: Oral care BID Follow up Recommendations: Skilled Nursing facility Plan: Continue with current plan of care    GO     Houston Siren 11/03/2014, 9:31 AM  Orbie Pyo Colvin Caroli.Ed [employer] [phone]

## 2014-11-03 NOTE — Discharge Summary (Signed)
PATIENT DETAILS Name: [name] Briggs Age: 81 y.o. Sex: female Date of Birth: [birth year] MRN: [id number]. Admitting Physician: Verlee Monte, MD TMA:[id number], Farley Ly, PA-C  Admit Date: 10/30/2014 Discharge date: 11/03/2014  Recommendations for Outpatient Follow-up:  1. Please obtain PT/OT evaluation while at group home 2. Please obtain palliative care evaluation was at group home 3. Repeat CBC and chemistries in 1-2 weeks.  PRIMARY DISCHARGE DIAGNOSIS:  Principal Problem:   Acute encephalopathy Active Problems:   Alzheimer disease   Hypothyroidism   Fall at nursing home   C1 cervical fracture   Scalp hematoma      PAST MEDICAL HISTORY: Past Medical History  Diagnosis Date  . Hypertension   . Alzheimer disease   . Depression   . Breast cancer   . Sleep apnea   . Hypothyroidism   . Anxiety   . Syncope and collapse   . Sinus bradycardia   . UTI (urinary tract infection)     ACUTE  . Dehydration     ACUTE  . Bradycardia     DISCHARGE MEDICATIONS: Current Discharge Medication List    START taking these medications   Details  traMADol (ULTRAM) 50 MG tablet Take 1 tablet (50 mg total) by mouth every 12 (twelve) hours as needed for moderate pain. Qty: 30 tablet, Refills: 0      CONTINUE these medications which have CHANGED   Details  feeding supplement, ENSURE ENLIVE, (ENSURE ENLIVE) LIQD Take 237 mLs by mouth 2 (two) times daily between meals. Qty: 237 mL, Refills: 12    ferrous sulfate 325 (65 FE) MG tablet Take 1 tablet (325 mg total) by mouth daily with breakfast. Qty: 60 tablet, Refills: 0    levothyroxine (SYNTHROID, LEVOTHROID) 125 MCG tablet Take 1 tablet (125 mcg total) by mouth daily before breakfast. Qty: 30 tablet, Refills: 0    losartan (COZAAR) 25 MG tablet Take 1 tablet (25 mg total) by mouth 2 (two) times daily. Qty: 60 tablet, Refills: 0    OLANZapine (ZYPREXA) 2.5 MG tablet Take 1 tablet (2.5 mg total) by mouth at bedtime. Qty: 30  tablet, Refills: 0    polyethylene glycol (MIRALAX / GLYCOLAX) packet Take 17 g by mouth daily as needed (for constipation). Qty: 14 each, Refills: 0    psyllium (REGULOID) 0.52 G capsule Take 1 capsule (0.52 g total) by mouth daily. Qty: 30 capsule, Refills: 0    ursodiol (ACTIGALL) 300 MG capsule Take 1 capsule (300 mg total) by mouth 3 (three) times daily. Qty: 90 capsule, Refills: 0    zolpidem (AMBIEN) 5 MG tablet Take 1 tablet (5 mg total) by mouth at bedtime as needed for sleep. Qty: 20 tablet, Refills: 0      CONTINUE these medications which have NOT CHANGED   Details  senna-docusate (SENOKOT-S) 8.6-50 MG per tablet Take 2 tablets by mouth daily.      STOP taking these medications     Calcium Carbonate-Vitamin D (CALCIUM + D PO)      LORazepam (ATIVAN) 0.5 MG tablet         ALLERGIES:   Allergies  Allergen Reactions  . Ace Inhibitors     REACTION: Swelling  . Bystolic [Nebivolol Hcl] Other (See Comments)    Bradycardia  . Ciprofloxacin     REACTION: GI Upset    BRIEF HPI:  See H&P, Labs, Consult and Test reports for all details in brief, patient is a 81 y.o. female with Past medical history of hypertension, Alzheimer's  dementia, hypothyroidism, recurrent fall, depression-presented with fall. CT scan was positive for a C1 fracture.  CONSULTATIONS:   Neurosurgery  PERTINENT RADIOLOGIC STUDIES: Ct Head Wo Contrast  10/31/2014   CLINICAL DATA:  [place]  EXAM: CT HEAD WITHOUT CONTRAST  TECHNIQUE: Contiguous axial images were obtained from the base of the skull through the vertex without intravenous contrast.  COMPARISON:  10/30/2014  FINDINGS: There is no intracranial hemorrhage or extra-axial fluid collection. There is moderately severe generalized atrophy. There is no evidence of acute infarction. There is no mass effect or midline shift. Basal cisterns remain patent. No interval change is evident from the scan obtained yesterday. Right frontal scalp hematoma again  evident.  The C1 fractures and left occipital condyle fracture are again evident, incompletely imaged on this study.  IMPRESSION: No evidence of hemorrhage, contusion or other evolving intracranial traumatic injury. Generalized atrophy. Right frontal scalp hematoma. Known fractures at C1 and left occipital condyle, incompletely imaged.   Electronically Signed   By: Andreas Newport M.D.   On: 10/31/2014 00:51   Ct Head Wo Contrast  10/30/2014   CLINICAL DATA:  Fall, scalp laceration over the frontal region, patient cannot give history  EXAM: CT HEAD WITHOUT CONTRAST  CT CERVICAL SPINE WITHOUT CONTRAST  TECHNIQUE: Multidetector CT imaging of the head and cervical spine was performed following the standard protocol without intravenous contrast. Multiplanar CT image reconstructions of the cervical spine were also generated.  COMPARISON:  07/11/2014  FINDINGS: CT HEAD FINDINGS  Large frontal scalp laceration/hematoma. No skull fracture. Prominent nutrient foramen left orbital roof stable from 07/11/2014. Motion artifact severely degrades this study. There is diffuse atrophy with no abnormal attenuation to suggest infarct mass hemorrhage or extra-axial fluid. Proportional dilatation of the ventricles noted.  CT CERVICAL SPINE FINDINGS  There are fractures of the anterior and posterior aspects of the C1 ring, both mildly displaced with comminution involving the posterior right C1 fracture. There is a mildly displaced fracture involving the left occipital condyle.  There is reversed lordosis. There is severe C4-5, C5-6, and C6-7 degenerative disc disease. There are no other fractures.  IMPRESSION: 1. No acute intracranial abnormality. Study moderately limited by motion. 2. Acute fractures of C1 and left occipital condyle. Critical Value/emergent results were called by telephone at the time of interpretation on 10/30/2014 at 8:39 am to Dr. Thayer Jew , who verbally acknowledged these results.   Electronically Signed    By: Skipper Cliche M.D.   On: 10/30/2014 08:39   Ct Cervical Spine Wo Contrast  10/30/2014   CLINICAL DATA:  Fall, scalp laceration over the frontal region, patient cannot give history  EXAM: CT HEAD WITHOUT CONTRAST  CT CERVICAL SPINE WITHOUT CONTRAST  TECHNIQUE: Multidetector CT imaging of the head and cervical spine was performed following the standard protocol without intravenous contrast. Multiplanar CT image reconstructions of the cervical spine were also generated.  COMPARISON:  07/11/2014  FINDINGS: CT HEAD FINDINGS  Large frontal scalp laceration/hematoma. No skull fracture. Prominent nutrient foramen left orbital roof stable from 07/11/2014. Motion artifact severely degrades this study. There is diffuse atrophy with no abnormal attenuation to suggest infarct mass hemorrhage or extra-axial fluid. Proportional dilatation of the ventricles noted.  CT CERVICAL SPINE FINDINGS  There are fractures of the anterior and posterior aspects of the C1 ring, both mildly displaced with comminution involving the posterior right C1 fracture. There is a mildly displaced fracture involving the left occipital condyle.  There is reversed lordosis. There is severe C4-5, C5-6,  and C6-7 degenerative disc disease. There are no other fractures.  IMPRESSION: 1. No acute intracranial abnormality. Study moderately limited by motion. 2. Acute fractures of C1 and left occipital condyle. Critical Value/emergent results were called by telephone at the time of interpretation on 10/30/2014 at 8:39 am to Dr. Thayer Jew , who verbally acknowledged these results.   Electronically Signed   By: Skipper Cliche M.D.   On: 10/30/2014 08:39   Dg Chest Port 1 View  10/31/2014   CLINICAL DATA:  Acute encephalopathy. History of hypertension. History of breast carcinoma.  EXAM: PORTABLE CHEST - 1 VIEW  COMPARISON:  03/23/2014  FINDINGS: Cardiac silhouette mildly enlarged.  No mediastinal or hilar masses.  No lung consolidation or edema. No  pleural effusion or pneumothorax.  Stable changes from left breast surgery.  Bony thorax is demineralized but grossly intact.  IMPRESSION: No acute cardiopulmonary disease.   Electronically Signed   By: Lajean Manes M.D.   On: 10/31/2014 12:17   Dg Knee Complete 4 Views Left  10/30/2014   CLINICAL DATA:  Fall, left knee abrasions, trauma, pain  EXAM: LEFT KNEE - COMPLETE 4+ VIEW  COMPARISON:  None.  FINDINGS: Osteopenia noted. Atrophy of the left lower extremity musculature. Normal alignment without acute displaced fracture or joint effusion. Lateral view is limited with motion artifact. Peripheral atherosclerosis noted.  IMPRESSION: No acute osseous finding or displaced fracture.  Osteopenia   Electronically Signed   By: Jerilynn Mages.  Shick M.D.   On: 10/30/2014 10:36   Ct Maxillofacial Wo Cm  10/30/2014   CLINICAL DATA:  Acute fall with forehead and right eye injury. Initial encounter.  EXAM: CT MAXILLOFACIAL WITHOUT CONTRAST  TECHNIQUE: Multidetector CT imaging of the maxillofacial structures was performed. Multiplanar CT image reconstructions were also generated. A small metallic BB was placed on the right temple in order to reliably differentiate right from left.  COMPARISON:  10/30/2014, 07/11/2014 and prior CTs.  FINDINGS: No acute facial fracture identified.  Right preseptal facial soft tissue swelling is identified. The globes and orbits are unremarkable. There is no evidence of postseptal or intraconal abnormality.  The paranasal sinuses, mastoid air cells and middle/ inner ears are clear.  C1 and left occipital condyle fractures again noted as seen on earlier exams today.  IMPRESSION: No evidence of acute facial fracture.  Right preseptal facial soft tissue swelling.   Electronically Signed   By: Margarette Canada M.D.   On: 10/30/2014 18:16     PERTINENT LAB RESULTS: CBC:  Recent Labs  11/01/14 0750  WBC 7.7  HGB 12.2  HCT 35.2*  PLT 128*   CMET CMP     Component Value Date/Time   NA 136  11/02/2014 0640   K 3.8 11/02/2014 0640   CL 103 11/02/2014 0640   CO2 28 11/02/2014 0640   GLUCOSE 106* 11/02/2014 0640   BUN 18 11/02/2014 0640   CREATININE 0.67 11/02/2014 0640   CALCIUM 8.8 11/02/2014 0640   PROT 7.7 10/31/2014 0620   ALBUMIN 4.0 10/31/2014 0620   AST 36 10/31/2014 0620   ALT 20 10/31/2014 0620   ALKPHOS 56 10/31/2014 0620   BILITOT 1.0 10/31/2014 0620   GFRNONAA 78* 11/02/2014 0640   GFRAA >90 11/02/2014 0640    GFR CrCl cannot be calculated (Unknown ideal weight.). No results for input(s): LIPASE, AMYLASE in the last 72 hours. No results for input(s): CKTOTAL, CKMB, CKMBINDEX, TROPONINI in the last 72 hours. Invalid input(s): POCBNP No results for input(s): DDIMER in  the last 72 hours. No results for input(s): HGBA1C in the last 72 hours. No results for input(s): CHOL, HDL, LDLCALC, TRIG, CHOLHDL, LDLDIRECT in the last 72 hours. No results for input(s): TSH, T4TOTAL, T3FREE, THYROIDAB in the last 72 hours.  Invalid input(s): FREET3 No results for input(s): VITAMINB12, FOLATE, FERRITIN, TIBC, IRON, RETICCTPCT in the last 72 hours. Coags: No results for input(s): INR in the last 72 hours.  Invalid input(s): PT Microbiology: Recent Results (from the past 240 hour(s))  MRSA PCR Screening     Status: None   Collection Time: 11/01/14  5:42 PM  Result Value Ref Range Status   MRSA by PCR NEGATIVE NEGATIVE Final    Comment:        The GeneXpert MRSA Assay (FDA approved for NASAL specimens only), is one component of a comprehensive MRSA colonization surveillance program. It is not intended to diagnose MRSA infection nor to guide or monitor treatment for MRSA infections.      BRIEF HOSPITAL COURSE:   Principal Problem:   Acute encephalopathy: Seems to have resolved, back to usual baseline. With significant amount of dementia at baseline. CT head was negative for acute abnormalities, UA and chest x-ray was negative for any infection. Ammonia level  was only minimally elevated but subsequently decreased back to normal range without any intervention.  Active Problems:   Fall with C1 and left occipital condyle of fracture: The patient was seen in consult with neurosurgery, recommendations are for cervical collar for 2-3 months and outpatient follow-up with neurosurgery.  Hematoma/sutured scalp laceration: No active bleeding. Supportive care.  Hypothyroidism: Continue with levothyroxine  Alzheimer disease mild behavioral disturbance: Suspect mental status back to baseline. Resumed Zyprexa, minimize benzodiazepines in the setting of frequent falls and sedation   Frequent falls: Suspect multifactorial-secondary to advanced age, failure to thrive syndrome, possibly benzodiazepine use. Being transferred to group home. Minimize benzodiazepines.   Hypertension: Resume losartan.   Advanced dementia/failure to thrive syndrome: Spoke with son over the phone-recommended palliative care evaluation while at SNF. PT/OT eval while at SNF/group home.  TODAY-DAY OF DISCHARGE:  Subjective:   [name] Briggs today appears calm and comfortable.  Objective:   Blood pressure 146/78, pulse 79, temperature 97.7 F (36.5 C), temperature source Oral, resp. rate 18, weight 51.8 kg (114 lb 3.2 oz), SpO2 98 %.  Intake/Output Summary (Last 24 hours) at 11/03/14 1424 Last data filed at 11/02/14 1700  Gross per 24 hour  Intake    200 ml  Output      0 ml  Net    200 ml   Filed Weights   11/01/14 0500 11/02/14 0510 11/03/14 0500  Weight: 52.2 kg (115 lb 1.3 oz) 51.6 kg (113 lb 12.1 oz) 51.8 kg (114 lb 3.2 oz)    Exam Awake and mumbles incoherently. [place].AT,PERRAL. Cervical collar in place Supple Neck,No JVD, No cervical lymphadenopathy appriciated.  Symmetrical Chest wall movement, Good air movement bilaterally, CTAB RRR,No Gallops,Rubs or new Murmurs, No Parasternal Heave +ve B.Sounds, Abd Soft, Non tender, No organomegaly appriciated, No rebound  -guarding or rigidity. No Cyanosis, Clubbing or edema, No new Rash or bruise  DISCHARGE CONDITION: Stable  DISPOSITION: Group home  CODE STATUS DO NOT RESUSCITATE  DISCHARGE INSTRUCTIONS:    Activity:  As tolerated with Full fall precautions use walker/cane & assistance as needed  Diet recommendation: Dysphagia 1 diet with nectar thick liquid  Discharge Instructions    Call MD for:  difficulty breathing, headache or visual disturbances  Complete by:  As directed      Call MD for:  severe uncontrolled pain    Complete by:  As directed      Diet - low sodium heart healthy    Complete by:  As directed   Dysphagia 1 (puree);Nectar-thick liquid     Increase activity slowly    Complete by:  As directed            Follow-up Information    Follow up with CABBELL,KYLE L, MD In 2 weeks.   Specialty:  Neurosurgery   Why:  after discharge, will need cervical spine xrays    Contact information:   [address] [place] [address] [phone]       Follow up with MORAITIS, LAURA B, PA-C. Schedule an appointment as soon as possible for a visit in 2 weeks.   Specialty:  Internal Medicine   Contact information:   [place] [address] [phone]       Total Time spent on discharge equals 45 minutes.  SignedOren Binet 11/03/2014 2:24 PM

## 2014-11-03 NOTE — Clinical Social Work Note (Signed)
Clinical Social Worker facilitated patient discharge including contacting patient family and facility to confirm patient discharge plans.  Clinical information faxed to facility and family agreeable with plan.  CSW arranged ambulance transport via [facility] to [facility].  RN to call report prior to discharge.  DC packet prepared and on chart for MD signature.   Clinical Social Worker will sign off for now as social work intervention is no longer needed. Please consult [place] again if new need arises.  Glendon Axe, MSW, LCSWA [phone] 11/03/2014 3:22 PM

## 2014-11-03 NOTE — Progress Notes (Signed)
Pt discharged at this time via [facility] transport alert with no noted distress. IV discontinued, dry dressing applied. Discharge paperwork sent with her to facility. Attempted x1 to call report but was unsuccessful.

## 2014-11-03 NOTE — Progress Notes (Signed)
I have called and left a message with son, Shanon Brow, and will await call back. Thank you for this consult.   Vinie Sill, NP Palliative Medicine Team Pager # [phone] (M-F 8a-5p) Team Phone # [phone] (Nights/Weekends)

## 2014-11-03 NOTE — Clinical Social Work Note (Signed)
Clinical Social Worker continues to follow pt and pt's family for continued support and to facilitate pt's discharge to [facility] once medically stable  FL-2 on chart for MD signature.   Glendon Axe, MSW, LCSWA [phone] 11/03/2014 11:18 AM

## 2014-11-03 NOTE — Consult Note (Signed)
Patient [name] Briggs      DOB: 29-Jun-1927      QMG:[id number]     Consult Note from the Palliative Medicine Team at [facility] Requested by: Dr. Exie Parody     PCP: Margarita Mail, PA-C Reason for Consultation: [place]     Phone Number:[phone]  Assessment of patients Current state: I met today at [name] Briggs's bedside her son, [name] Briggs. He has very good understanding of her natural disease process and says he has dealt with the same disease with his father. He is encouraged by her eating more and swallowing a little better. He acknowledges that this may fluctuate and may decline at any time. He is definitely inclined for comfort and what is best for his mother. Confirms DNR and would never want a feeding tube. We discussed MOST form which he wishes to consider decisions further and was educated he can complete with any provider at any time. He is encouraged by securing her a bed at [facility] and feels that she will get the care and attention she needs there. He would like her to be followed by PT/OT/SLP to try and maximize her health and says that she was ambulating independently (with many falls) but would always be walking and pacing prior to admission. He is open to considering hospice again for her with any further decline.    Goals of Care: 1.  Code Status: DNR   2. Disposition:    3. Symptom Management:   1. Pain: I would recommend to treat pain with acetaminophen and could consider scheduled acetaminophen. Could also consider lidoderm patch over cervical spine. Would favor low dose tramadol over opioids for [name] Briggs.  2. Bowel Regimen: Would recommend bowel regimen to prevent constipation such as Senokot-S 2 tablets qhs along with Sorbitol daily prn.   4. Psychosocial: Emotional support provided to patient and family.    Patient Documents Completed or Given: Document Given Completed  Advanced Directives Pkt    MOST yes   DNR    Gone from [facility]    Hard  Choices      Brief HPI: 81 yo with history of hypertension, advanced Alzheimer's dementia, hypothyroidism who resides at an ALF was found after sustaining a fall. Fall was not witnessed. The patient was brought to emergency department where she had negative CT of the head and maxillofacial areas however positive C1 fracture of cervical spine. Patient also has a right frontal hematoma. Neurosurgery (Dr. Christella Noa) consulted by the EDP and was recommended cervical collar placement, nonsurgical management and outpatient follow-up in 2 weeks. She also has advanced Alzheimer's dementia and was previously on hospice care until released. SLP following with diet recommendations with dysphagia. PMH reviewed below.    ROS: Unable to assess - confused and does not answer questions appropriately.     PMH:  Past Medical History  Diagnosis Date  . Hypertension   . Alzheimer disease   . Depression   . Breast cancer   . Sleep apnea   . Hypothyroidism   . Anxiety   . Syncope and collapse   . Sinus bradycardia   . UTI (urinary tract infection)     ACUTE  . Dehydration     ACUTE  . Bradycardia      PSH: Past Surgical History  Procedure Laterality Date  . Cholecystectomy    . Breast surgery      bilteral mastectomy's   I have reviewed the FH and SH  and  If appropriate update it with new information. Allergies  Allergen Reactions  . Ace Inhibitors     REACTION: Swelling  . Bystolic [Nebivolol Hcl] Other (See Comments)    Bradycardia  . Ciprofloxacin     REACTION: GI Upset   Scheduled Meds: . acetaminophen (TYLENOL) oral liquid 160 mg/5 mL  650 mg Oral QID  . feeding supplement (ENSURE ENLIVE)  237 mL Oral BID BM  . ferrous sulfate  325 mg Oral Q breakfast  . levothyroxine  125 mcg Oral QAC breakfast  . losartan  25 mg Oral BID  . OLANZapine  2.5 mg Oral QHS  . sodium chloride  3 mL Intravenous Q12H  . ursodiol  300 mg Oral TID   Continuous Infusions:  PRN Meds:.acetaminophen  (TYLENOL) oral liquid 160 mg/5 mL, hydrALAZINE, morphine injection, [DISCONTINUED] ondansetron **OR** ondansetron (ZOFRAN) IV, RESOURCE THICKENUP CLEAR    BP 123/80 mmHg  Pulse 60  Temp(Src) 98 F (36.7 C) (Oral)  Resp 18  Wt 51.8 kg (114 lb 3.2 oz)  SpO2 100%   PPS: 20%   Intake/Output Summary (Last 24 hours) at 11/03/14 1249 Last data filed at 11/02/14 1700  Gross per 24 hour  Intake    200 ml  Output      0 ml  Net    200 ml    Physical Exam:  General: NAD, thin, frail, elderly HEENT: Cervical collar in place, large frontal hematoma with sutured laceration, bruising to face Chest: No labored breathing, symmetric CVS: RRR Abdomen: Soft, NT, ND Ext: MAE, no edema, warm to touch Neuro: Awake, alert, unable to follow commands, is verbal but inappropriate speech  Labs: CBC    Component Value Date/Time   WBC 7.7 11/01/2014 0750   WBC 4.4 11/03/2008 0801   RBC 3.86* 11/01/2014 0750   RBC 4.24 11/03/2008 0801   HGB 12.2 11/01/2014 0750   HGB 13.3 11/03/2008 0801   HCT 35.2* 11/01/2014 0750   HCT 38.4 11/03/2008 0801   PLT 128* 11/01/2014 0750   PLT 129* 11/03/2008 0801   MCV 91.2 11/01/2014 0750   MCV 90.6 11/03/2008 0801   MCH 31.6 11/01/2014 0750   MCH 31.3 11/03/2008 0801   MCHC 34.7 11/01/2014 0750   MCHC 34.6 11/03/2008 0801   RDW 13.4 11/01/2014 0750   RDW 13.5 11/03/2008 0801   LYMPHSABS 0.9 10/31/2014 0845   LYMPHSABS 1.9 11/03/2008 0801   MONOABS 0.6 10/31/2014 0845   MONOABS 0.4 11/03/2008 0801   EOSABS 0.0 10/31/2014 0845   EOSABS 0.2 11/03/2008 0801   BASOSABS 0.0 10/31/2014 0845   BASOSABS 0.0 11/03/2008 0801    BMET    Component Value Date/Time   NA 136 11/02/2014 0640   K 3.8 11/02/2014 0640   CL 103 11/02/2014 0640   CO2 28 11/02/2014 0640   GLUCOSE 106* 11/02/2014 0640   BUN 18 11/02/2014 0640   CREATININE 0.67 11/02/2014 0640   CALCIUM 8.8 11/02/2014 0640   GFRNONAA 78* 11/02/2014 0640   GFRAA >90 11/02/2014 0640    CMP      Component Value Date/Time   NA 136 11/02/2014 0640   K 3.8 11/02/2014 0640   CL 103 11/02/2014 0640   CO2 28 11/02/2014 0640   GLUCOSE 106* 11/02/2014 0640   BUN 18 11/02/2014 0640   CREATININE 0.67 11/02/2014 0640   CALCIUM 8.8 11/02/2014 0640   PROT 7.7 10/31/2014 0620   ALBUMIN 4.0 10/31/2014 0620   AST 36 10/31/2014 4739  ALT 20 10/31/2014 0620   ALKPHOS 56 10/31/2014 0620   BILITOT 1.0 10/31/2014 0620   GFRNONAA 78* 11/02/2014 0640   GFRAA >90 11/02/2014 0640    Time In Time Out Total Time Spent with Patient Total Overall Time  1245 1345 93min 31min    Greater than 50%  of this time was spent counseling and coordinating care related to the above assessment and plan.  Vinie Sill, NP Palliative Medicine Team Pager # [phone] (M-F 8a-5p) Team Phone # [phone] (Nights/Weekends)

## 2015-07-11 IMAGING — CT CT MAXILLOFACIAL W/O CM
2 series · 11 of 40 positions shown, 13 images · non-contrast
Comparison: 10/30/2014, 07/11/2014 and prior CTs.

CLINICAL DATA: Acute fall with forehead and right eye injury.
Initial encounter.

EXAM:
CT MAXILLOFACIAL WITHOUT CONTRAST
TECHNIQUE: Multidetector CT imaging of the maxillofacial structures was
performed. Multiplanar CT image reconstructions were also generated.
A small metallic BB was placed on the right temple in order to
reliably differentiate right from left.

[Series 8: maxillofacial 2.0 coronal · coronal · 0.35mm/px · 3 of 86 slices shown]
[im 38/86  bone]
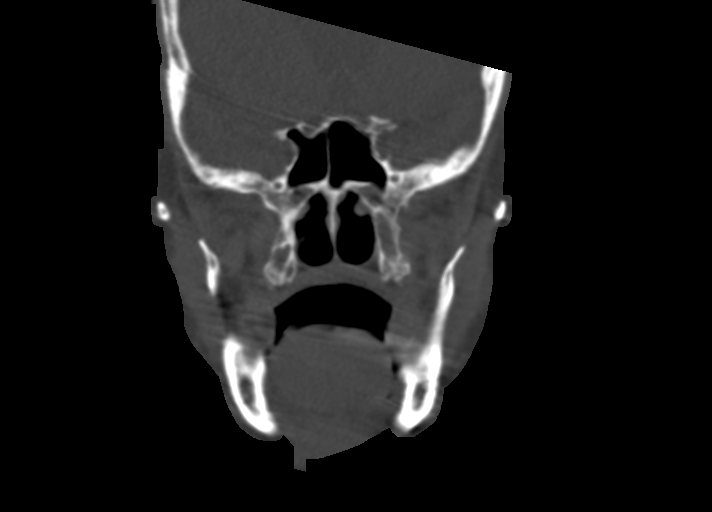
[im 40/86  bone]
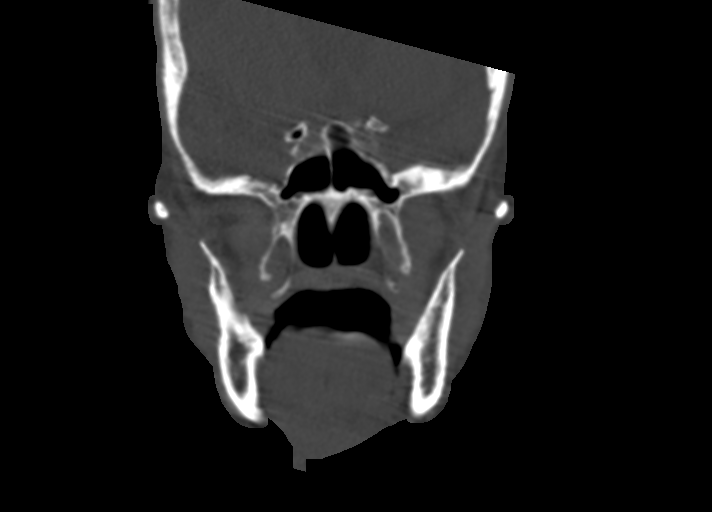
[im 46/86  bone]
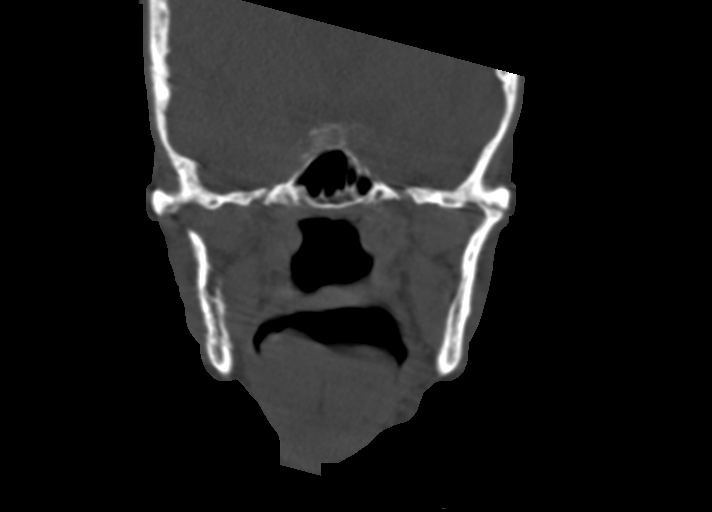

[Series 9: maxillofacial 2.0 sagittal · sagittal · 0.36mm/px · 8 of 73 slices shown, 10 images]
[im 12/73  brain]
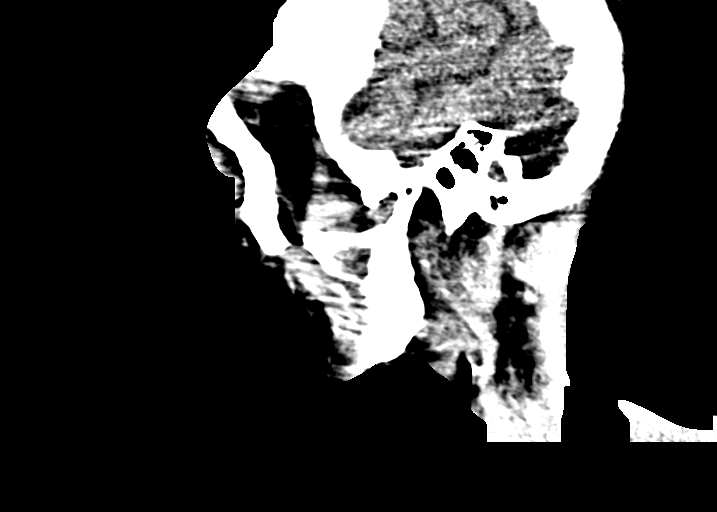
[im 12/73  bone]
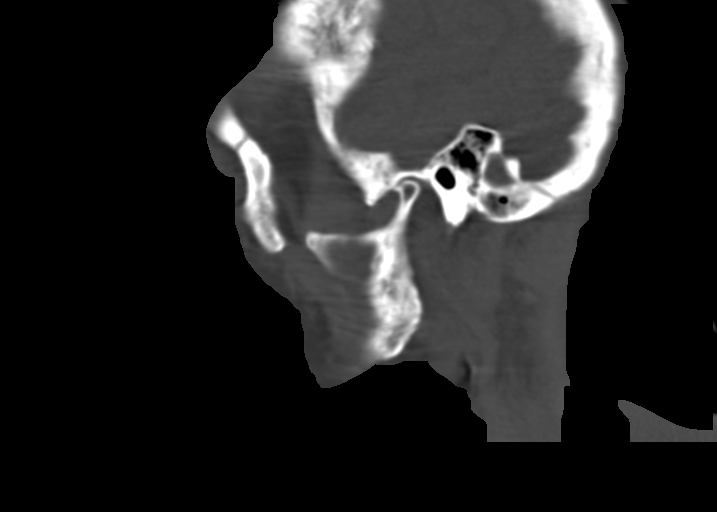
[im 23/73  bone]
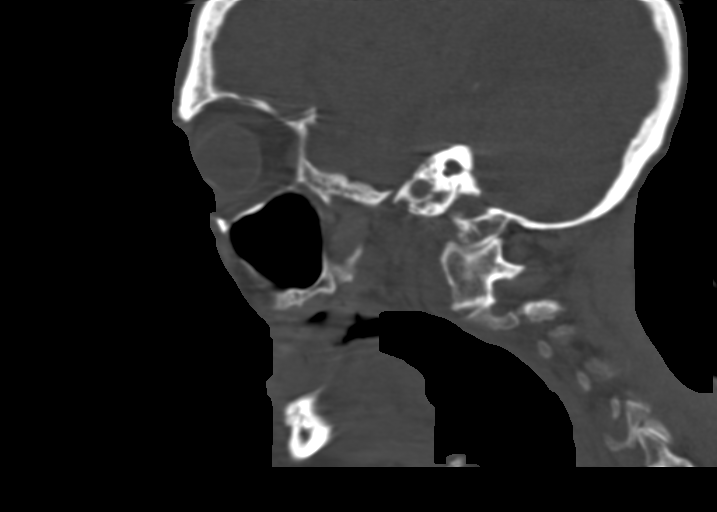
[im 28/73  bone]
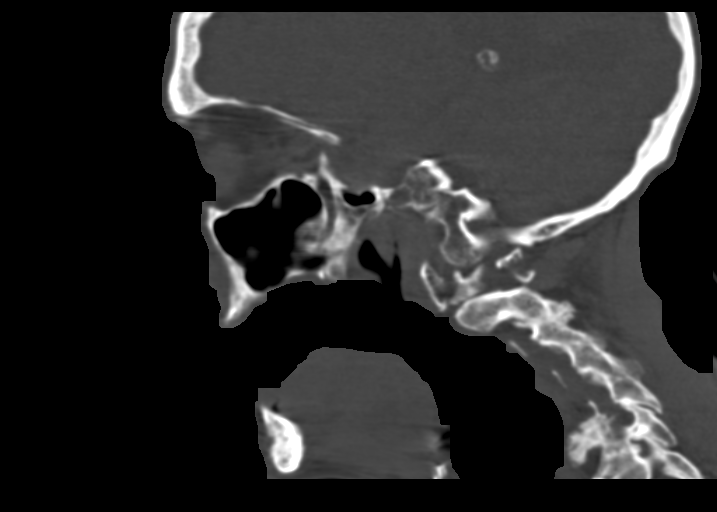
[im 34/73  bone]
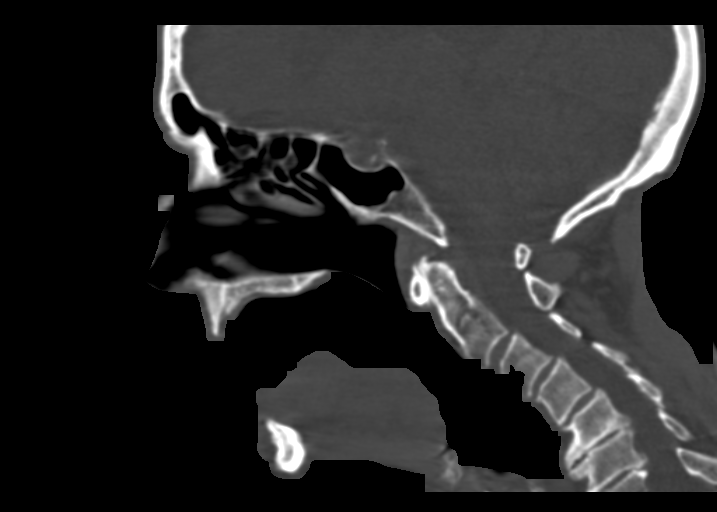
[im 39/73  brain]
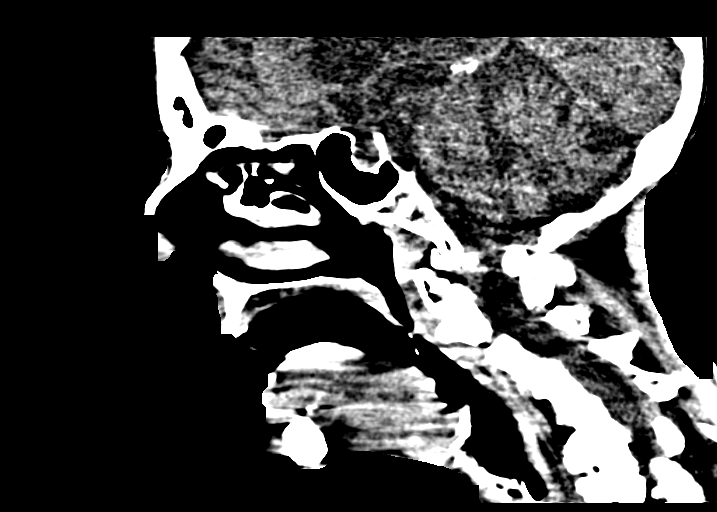
[im 39/73  bone]
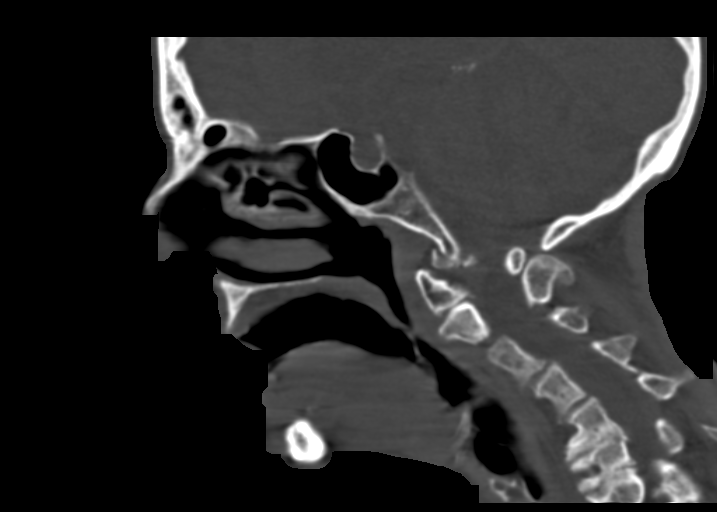
[im 45/73  bone]
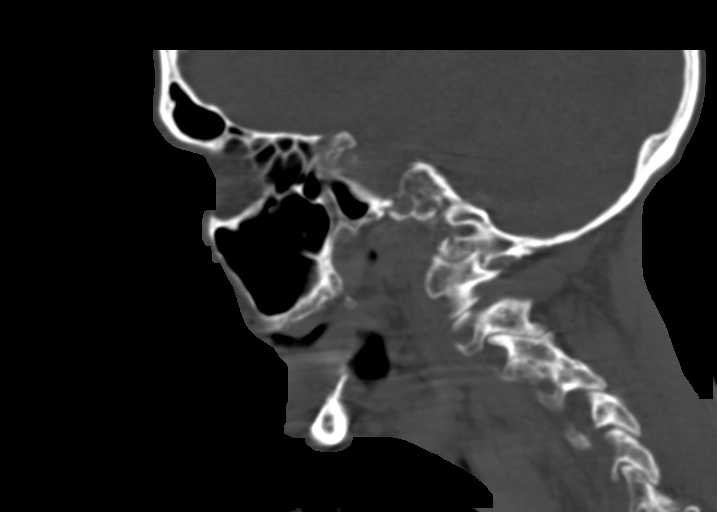
[im 56/73  bone]
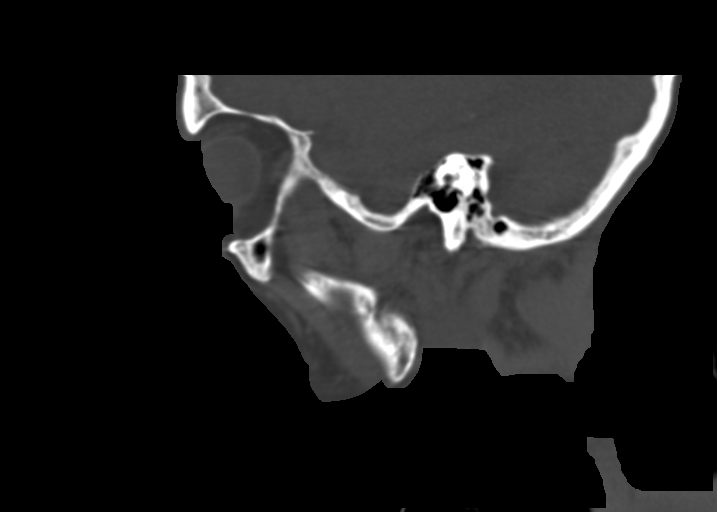
[im 67/73  bone]
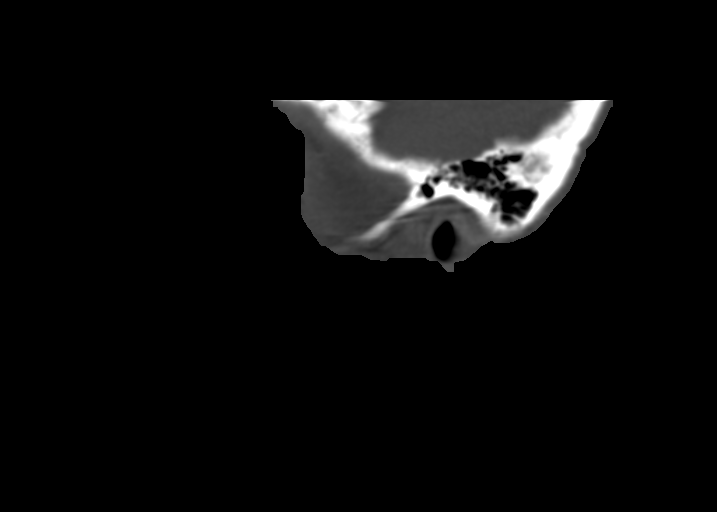

[11 of 40 positions shown; findings below may reference images not displayed]

FINDINGS: No acute facial fracture identified.

Right preseptal facial soft tissue swelling is identified. The
globes and orbits are unremarkable. There is no evidence of
postseptal or intraconal abnormality.

The paranasal sinuses, mastoid air cells and middle/ inner ears are
clear.

C1 and left occipital condyle fractures again noted as seen on
earlier exams today.
IMPRESSION: No evidence of acute facial fracture.

Right preseptal facial soft tissue swelling.

## 2015-07-12 IMAGING — CR DG CHEST 1V PORT
1 series · 1 of 1 positions shown · non-contrast
Comparison: 03/23/2014

CLINICAL DATA: Acute encephalopathy. History of hypertension.
History of breast carcinoma.

EXAM:
PORTABLE CHEST - 1 VIEW

[AP]
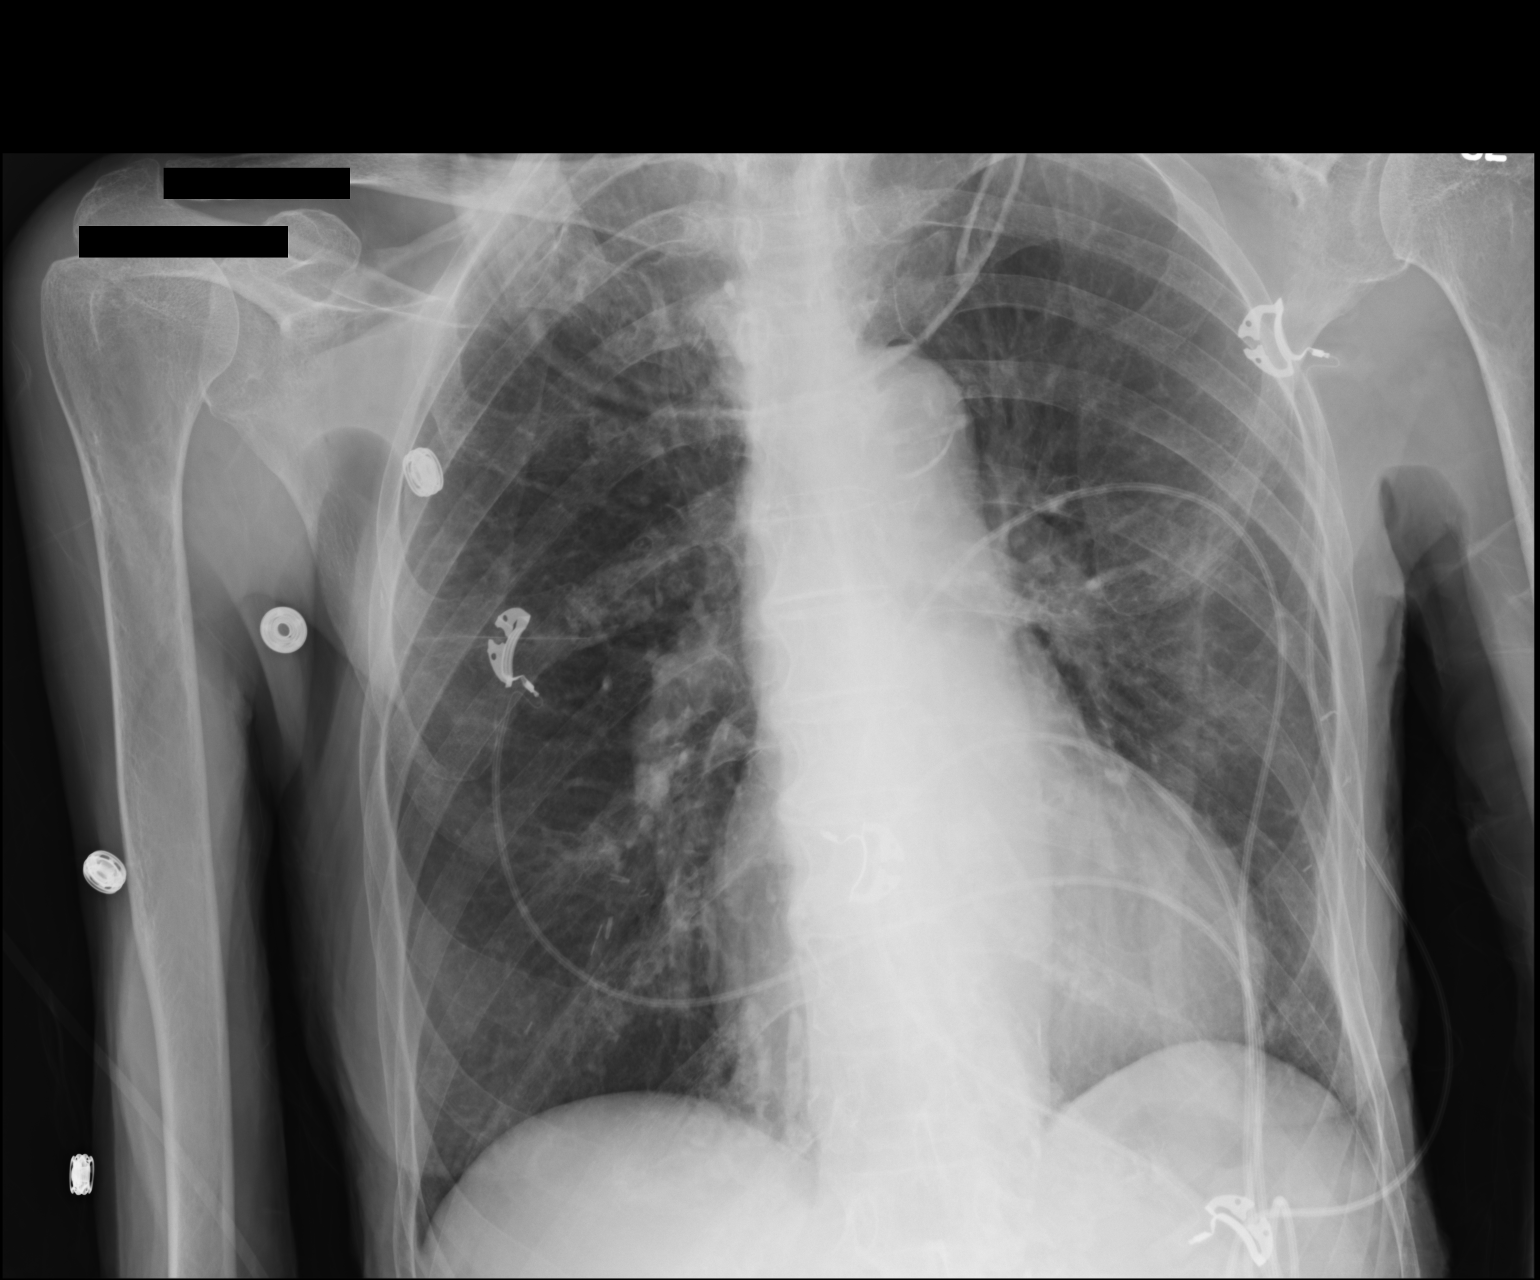

[1 of 1 positions shown; findings below may reference images not displayed]

FINDINGS: Cardiac silhouette mildly enlarged.  No mediastinal or hilar masses.

No lung consolidation or edema. No pleural effusion or pneumothorax.

Stable changes from left breast surgery.

Bony thorax is demineralized but grossly intact.
IMPRESSION: No acute cardiopulmonary disease.

## 2017-02-10 DEATH — deceased
